# Patient Record
Sex: Female | Born: 1980 | Race: White | Hispanic: No | Smoking: Never smoker
Health system: Southern US, Community
[De-identification: ages and names within clinical notes are randomized; demographics above are authoritative.]

## PROBLEM LIST (undated history)

## (undated) DIAGNOSIS — Z8659 Personal history of other mental and behavioral disorders: Secondary | ICD-10-CM

## (undated) DIAGNOSIS — I471 Supraventricular tachycardia, unspecified: Secondary | ICD-10-CM

## (undated) DIAGNOSIS — R51 Headache: Secondary | ICD-10-CM

## (undated) DIAGNOSIS — I10 Essential (primary) hypertension: Secondary | ICD-10-CM

## (undated) HISTORY — DX: Headache: R51

## (undated) HISTORY — PX: WISDOM TOOTH EXTRACTION: SHX21

## (undated) HISTORY — PX: NO PAST SURGERIES: SHX2092

## (undated) HISTORY — DX: Essential (primary) hypertension: I10

---

## 2003-03-24 ENCOUNTER — Other Ambulatory Visit: Admission: RE | Admit: 2003-03-24 | Discharge: 2003-03-24 | Payer: Self-pay | Admitting: Obstetrics and Gynecology

## 2003-11-20 ENCOUNTER — Other Ambulatory Visit: Admission: RE | Admit: 2003-11-20 | Discharge: 2003-11-20 | Payer: Self-pay | Admitting: Obstetrics and Gynecology

## 2004-03-21 ENCOUNTER — Emergency Department (HOSPITAL_COMMUNITY): Admission: EM | Admit: 2004-03-21 | Discharge: 2004-03-21 | Payer: Self-pay | Admitting: Emergency Medicine

## 2004-05-30 ENCOUNTER — Inpatient Hospital Stay (HOSPITAL_COMMUNITY): Admission: AD | Admit: 2004-05-30 | Discharge: 2004-06-03 | Payer: Self-pay | Admitting: Obstetrics and Gynecology

## 2004-07-06 ENCOUNTER — Other Ambulatory Visit: Admission: RE | Admit: 2004-07-06 | Discharge: 2004-07-06 | Payer: Self-pay | Admitting: Obstetrics and Gynecology

## 2006-01-22 ENCOUNTER — Emergency Department (HOSPITAL_COMMUNITY): Admission: EM | Admit: 2006-01-22 | Discharge: 2006-01-22 | Payer: Self-pay | Admitting: Emergency Medicine

## 2006-07-13 ENCOUNTER — Inpatient Hospital Stay (HOSPITAL_COMMUNITY): Admission: RE | Admit: 2006-07-13 | Discharge: 2006-07-15 | Payer: Self-pay | Admitting: Obstetrics and Gynecology

## 2007-01-19 ENCOUNTER — Emergency Department (HOSPITAL_COMMUNITY): Admission: EM | Admit: 2007-01-19 | Discharge: 2007-01-19 | Payer: Self-pay | Admitting: Emergency Medicine

## 2009-01-31 ENCOUNTER — Emergency Department (HOSPITAL_COMMUNITY): Admission: EM | Admit: 2009-01-31 | Discharge: 2009-01-31 | Payer: Self-pay | Admitting: Emergency Medicine

## 2009-10-22 ENCOUNTER — Inpatient Hospital Stay (HOSPITAL_COMMUNITY): Admission: RE | Admit: 2009-10-22 | Discharge: 2009-10-24 | Payer: Self-pay | Admitting: Obstetrics and Gynecology

## 2010-02-28 NOTE — L&D Delivery Note (Signed)
Delivery Note I examined the pt at about 1245 and AROM clear fluid.  She was comfortable with epidural, VE 5 cm.  She continued to progress to complete and pushed well.  At 4:40 PM a viable and healthy female was delivered via Vaginal, Spontaneous Delivery (Presentation: Left Occiput Anterior).  APGAR: 9, 9; weight 9 lbs, 6 oz .   Placenta status: Intact, Spontaneous.  Cord: 3 vessels with the following complications: None.    Anesthesia: Epidural  Episiotomy: None Lacerations: None Suture Repair: none Est. Blood Loss (mL): 400  Mom to postpartum.  Baby to nursery-stable.  Alisha Bacus D 02/04/2011, 5:04 PM

## 2010-05-14 LAB — CBC
HCT: 18.5 % — ABNORMAL LOW (ref 36.0–46.0)
Hemoglobin: 9.6 g/dL — ABNORMAL LOW (ref 12.0–15.0)
MCH: 26.7 pg (ref 26.0–34.0)
MCH: 27.3 pg (ref 26.0–34.0)
MCHC: 33.9 g/dL (ref 30.0–36.0)
MCV: 80.3 fL (ref 78.0–100.0)
MCV: 80.4 fL (ref 78.0–100.0)
RBC: 3.6 MIL/uL — ABNORMAL LOW (ref 3.87–5.11)
RDW: 15.6 % — ABNORMAL HIGH (ref 11.5–15.5)

## 2010-05-14 LAB — COMPREHENSIVE METABOLIC PANEL
AST: 25 U/L (ref 0–37)
Alkaline Phosphatase: 130 U/L — ABNORMAL HIGH (ref 39–117)
CO2: 20 mEq/L (ref 19–32)
Chloride: 111 mEq/L (ref 96–112)
Creatinine, Ser: 0.59 mg/dL (ref 0.4–1.2)
GFR calc non Af Amer: >60 mL/min (ref >60)
Potassium: 3.6 mEq/L (ref 3.5–5.1)
Total Bilirubin: 0.3 mg/dL (ref 0.3–1.2)

## 2010-05-14 LAB — URIC ACID: Uric Acid, Serum: 5.3 mg/dL (ref 2.4–7.0)

## 2010-05-14 LAB — RPR: RPR Ser Ql: NONREACTIVE

## 2010-06-25 ENCOUNTER — Inpatient Hospital Stay (HOSPITAL_COMMUNITY): Payer: BC Managed Care – PPO

## 2010-06-25 ENCOUNTER — Inpatient Hospital Stay (HOSPITAL_COMMUNITY)
Admission: AD | Admit: 2010-06-25 | Discharge: 2010-06-25 | Disposition: A | Discharge status: Home / Self Care | Payer: BC Managed Care – PPO | Source: Ambulatory Visit | Attending: Obstetrics and Gynecology | Admitting: Obstetrics and Gynecology

## 2010-06-25 DIAGNOSIS — R109 Unspecified abdominal pain: Secondary | ICD-10-CM

## 2010-06-25 DIAGNOSIS — Z30431 Encounter for routine checking of intrauterine contraceptive device: Secondary | ICD-10-CM

## 2010-06-25 DIAGNOSIS — O99891 Other specified diseases and conditions complicating pregnancy: Secondary | ICD-10-CM | POA: Insufficient documentation

## 2010-06-25 DIAGNOSIS — O9989 Other specified diseases and conditions complicating pregnancy, childbirth and the puerperium: Secondary | ICD-10-CM

## 2010-07-16 NOTE — H&P (Signed)
NAME:  Jocelyn Bridges, Jocelyn Bridges                   ACCOUNT NO.:  0   MEDICAL RECORD NO.:  [id number]             PATIENT TYPE:   LOCATION:                                 FACILITY:   PHYSICIAN:  David C. Rana Snare, M.D.    DATE OF BIRTH:  January 19, 1981   DATE OF ADMISSION:  05/30/2004  DATE OF DISCHARGE:                                HISTORY & PHYSICAL   She is being admitted through Labor & Delivery.   HPI:  Ms. Goga is a 30 year old G1 at 37-1/[redacted] weeks gestational age.  She  presents for two-stage induction due to preeclampsia.  She has been on  bedrest for the last 3-4 weeks, having elevated blood pressures in the 130s-  140s/80s-90s with no protein.  She presents for induction of labor.  EDC is  June 15, 2004.  She has a negative Group B Strep and at the time of  presentation she has no CNS symptoms.   PAST MEDICAL HISTORY:  Negative.   PAST SURGICAL HISTORY:  Also is negative.   MEDICATIONS:  Prenatal vitamins.   SHE HAS NO KNOWN DRUG ALLERGIES.   Blood type is O positive.  VDRL nonreactive, Rubella immune, Hepatitis B  negative and Gonorrhea and Chlamydia were also negative.   PHYSICAL EXAM:  On May 27, 2004, blood pressure 132/88, fetal heart tones  were regular.  ABDOMEN:  Gravida, 37 cm.  CERVIX:  Closed, thick and high.   IMPRESSION AND PLAN:  Intrauterine pregnancy at 37-1/[redacted] weeks gestational age  with mild preeclampsia.  Plan two-stage induction of labor, plan Cytotec  cervical ripening followed by Pitocin and amniotomy.  Will check PIH labs  and for now will not use magnesium sulfate unless CNS symptoms present.      DCL/MEDQ  D:  05/30/2004  T:  05/30/2004  Job:  [id number]

## 2010-07-16 NOTE — Op Note (Signed)
NAMETOVA, VATER NO.:  [id number]   MEDICAL RECORD NO.:  [id number]          PATIENT TYPE:  INP   LOCATION:  9147                          FACILITY:  [facility]   PHYSICIAN:  Juluis Mire, M.D.   DATE OF BIRTH:  01/10/1981   DATE OF PROCEDURE:  06/01/2004  DATE OF DISCHARGE:                                 OPERATIVE REPORT   DELIVERY NOTE:  The patient had spontaneous vaginal delivery of a viable  female.  Apgars were 8/9.  PH is pending.  There was no episiotomy.  She did  have a left-sided wall vaginal tear and a third degree extension of a  midline tear that went to the right of the rectum.  The sphincter was  disrupted somewhat.  There was no evidence of any rectal or mucosal injury.  First the left pelvic sidewall laceration was repaired with a running  locking suture of 2-0 chromic.  Next, we went and repaired the rectal  sphincter with interrupted figure-of-eights of 2-0 chromic.  The deep  muscles were also bed together  with figure-of-eights of 2-0 chromic.  Then  we started the vaginal apex with the tear and did a running suture of 3-0  chromic down to the vaginal opening.  Next, we did deeper sutures of 3-0  chromic and ran it down to near the rectal opening. Then we did a  subcuticular stitch to bring the perineal skin together.  After this we had  good approximation.  Rectal exam was unremarkable.  There was no active  bleeding.  Placenta had been removed intact with a three-vessel cord.  The  estimated blood loss was 500 cc.  Anesthesia was epidural and local 1%  Xylocaine.  Patient and baby were doing well postpartum.      JSM/MEDQ  D:  06/01/2004  T:  06/01/2004  Job:  [id number]

## 2010-08-19 LAB — ABO/RH

## 2010-08-19 LAB — HIV ANTIBODY (ROUTINE TESTING W REFLEX): HIV: NONREACTIVE

## 2010-08-19 LAB — RUBELLA ANTIBODY, IGM: Rubella: IMMUNE

## 2010-08-19 LAB — ANTIBODY SCREEN: Antibody Screen: NEGATIVE

## 2010-08-19 LAB — HEPATITIS B SURFACE ANTIGEN: Hepatitis B Surface Ag: NEGATIVE

## 2010-08-19 LAB — RPR: RPR: NONREACTIVE

## 2010-08-19 LAB — GC/CHLAMYDIA PROBE AMP, GENITAL: Gonorrhea: NEGATIVE

## 2011-01-31 ENCOUNTER — Encounter (HOSPITAL_COMMUNITY): Payer: Self-pay | Admitting: *Deleted

## 2011-01-31 ENCOUNTER — Telehealth (HOSPITAL_COMMUNITY): Payer: Self-pay | Admitting: *Deleted

## 2011-01-31 NOTE — Telephone Encounter (Signed)
Preadmission screen  

## 2011-02-01 ENCOUNTER — Encounter (HOSPITAL_COMMUNITY): Payer: Self-pay | Admitting: *Deleted

## 2011-02-04 ENCOUNTER — Inpatient Hospital Stay (HOSPITAL_COMMUNITY)
Admission: RE | Admit: 2011-02-04 | Discharge: 2011-02-06 | DRG: 373 | Disposition: A | Discharge status: Home / Self Care | Payer: BC Managed Care – PPO | Source: Ambulatory Visit | Attending: Obstetrics and Gynecology | Admitting: Obstetrics and Gynecology

## 2011-02-04 ENCOUNTER — Inpatient Hospital Stay (HOSPITAL_COMMUNITY): Payer: BC Managed Care – PPO | Admitting: Anesthesiology

## 2011-02-04 ENCOUNTER — Encounter (HOSPITAL_COMMUNITY): Payer: Self-pay | Admitting: Anesthesiology

## 2011-02-04 ENCOUNTER — Encounter (HOSPITAL_COMMUNITY): Payer: Self-pay

## 2011-02-04 DIAGNOSIS — Z3490 Encounter for supervision of normal pregnancy, unspecified, unspecified trimester: Secondary | ICD-10-CM

## 2011-02-04 DIAGNOSIS — O99892 Other specified diseases and conditions complicating childbirth: Principal | ICD-10-CM | POA: Diagnosis present

## 2011-02-04 DIAGNOSIS — Z22330 Carrier of Group B streptococcus: Secondary | ICD-10-CM

## 2011-02-04 HISTORY — DX: Encounter for supervision of normal pregnancy, unspecified, unspecified trimester: Z34.90

## 2011-02-04 LAB — CBC
Platelets: 227 10*3/uL (ref 150–400)
RDW: 16.2 % — ABNORMAL HIGH (ref 11.5–15.5)
WBC: 8.2 10*3/uL (ref 4.0–10.5)

## 2011-02-04 LAB — HIV ANTIBODY (ROUTINE TESTING W REFLEX): HIV: NONREACTIVE

## 2011-02-04 LAB — ABO/RH: ABO/RH(D): O POS

## 2011-02-04 MED ORDER — EPHEDRINE 5 MG/ML INJ: 10.0000 mg | INTRAVENOUS | Status: DC | PRN

## 2011-02-04 MED ORDER — EPHEDRINE 5 MG/ML INJ
10.0000 mg | INTRAVENOUS | Status: DC | PRN
  Filled 2011-02-04: qty 4

## 2011-02-04 MED ORDER — OXYCODONE-ACETAMINOPHEN 5-325 MG PO TABS: 1.0000 | ORAL_TABLET | ORAL | Status: DC | PRN

## 2011-02-04 MED ORDER — LACTATED RINGERS IV SOLN: 500.0000 mL | INTRAVENOUS | Status: DC | PRN

## 2011-02-04 MED ORDER — ONDANSETRON HCL 4 MG/2ML IJ SOLN: 4.0000 mg | INTRAMUSCULAR | Status: DC | PRN

## 2011-02-04 MED ORDER — OXYCODONE-ACETAMINOPHEN 5-325 MG PO TABS
1.0000 | ORAL_TABLET | ORAL | Status: DC | PRN
  Administered 2011-02-05 – 2011-02-06 (×5): 1 via ORAL
  Filled 2011-02-04 (×6): qty 1

## 2011-02-04 MED ORDER — DIBUCAINE 1 % RE OINT: 1.0000 "application " | TOPICAL_OINTMENT | RECTAL | Status: DC | PRN

## 2011-02-04 MED ORDER — SIMETHICONE 80 MG PO CHEW: 80.0000 mg | CHEWABLE_TABLET | ORAL | Status: DC | PRN

## 2011-02-04 MED ORDER — METHYLERGONOVINE MALEATE 0.2 MG PO TABS: 0.2000 mg | ORAL_TABLET | ORAL | Status: DC | PRN

## 2011-02-04 MED ORDER — ONDANSETRON HCL 4 MG/2ML IJ SOLN: 4.0000 mg | Freq: Four times a day (QID) | INTRAMUSCULAR | Status: DC | PRN

## 2011-02-04 MED ORDER — PENICILLIN G POTASSIUM 5000000 UNITS IJ SOLR
5.0000 10*6.[IU] | Freq: Once | INTRAVENOUS | Status: AC
  Administered 2011-02-04: 5 10*6.[IU] via INTRAVENOUS
  Filled 2011-02-04: qty 5

## 2011-02-04 MED ORDER — OXYTOCIN 20 UNITS IN LACTATED RINGERS INFUSION - SIMPLE
1.0000 m[IU]/min | INTRAVENOUS | Status: DC
  Administered 2011-02-04: 333 m[IU]/min via INTRAVENOUS
  Administered 2011-02-04: 2 m[IU]/min via INTRAVENOUS
  Filled 2011-02-04: qty 1000

## 2011-02-04 MED ORDER — BUPIVACAINE HCL (PF) 0.25 % IJ SOLN
INTRAMUSCULAR | Status: DC | PRN
  Administered 2011-02-04: 5 mL

## 2011-02-04 MED ORDER — METHYLERGONOVINE MALEATE 0.2 MG/ML IJ SOLN: 0.2000 mg | INTRAMUSCULAR | Status: DC | PRN

## 2011-02-04 MED ORDER — IBUPROFEN 600 MG PO TABS: 600.0000 mg | ORAL_TABLET | Freq: Four times a day (QID) | ORAL | Status: DC | PRN

## 2011-02-04 MED ORDER — LANOLIN HYDROUS EX OINT: TOPICAL_OINTMENT | CUTANEOUS | Status: DC | PRN

## 2011-02-04 MED ORDER — LIDOCAINE HCL 1.5 % IJ SOLN
INTRAMUSCULAR | Status: DC | PRN
  Administered 2011-02-04 (×2): 5 mL via EPIDURAL

## 2011-02-04 MED ORDER — PENICILLIN G POTASSIUM 5000000 UNITS IJ SOLR
2.5000 10*6.[IU] | INTRAVENOUS | Status: DC
  Administered 2011-02-04 (×2): 2.5 10*6.[IU] via INTRAVENOUS
  Filled 2011-02-04 (×5): qty 2.5

## 2011-02-04 MED ORDER — TERBUTALINE SULFATE 1 MG/ML IJ SOLN: 0.2500 mg | Freq: Once | INTRAMUSCULAR | Status: DC | PRN

## 2011-02-04 MED ORDER — DIPHENHYDRAMINE HCL 25 MG PO CAPS
25.0000 mg | ORAL_CAPSULE | Freq: Four times a day (QID) | ORAL | Status: DC | PRN
  Administered 2011-02-04: 25 mg via ORAL
  Filled 2011-02-04: qty 1

## 2011-02-04 MED ORDER — SENNOSIDES-DOCUSATE SODIUM 8.6-50 MG PO TABS
2.0000 | ORAL_TABLET | Freq: Every day | ORAL | Status: DC
  Administered 2011-02-05: 2 via ORAL

## 2011-02-04 MED ORDER — IBUPROFEN 600 MG PO TABS
600.0000 mg | ORAL_TABLET | Freq: Four times a day (QID) | ORAL | Status: DC
  Administered 2011-02-04 – 2011-02-06 (×6): 600 mg via ORAL
  Filled 2011-02-04 (×6): qty 1

## 2011-02-04 MED ORDER — MEASLES, MUMPS & RUBELLA VAC ~~LOC~~ INJ: 0.5000 mL | INJECTION | Freq: Once | SUBCUTANEOUS | Status: DC

## 2011-02-04 MED ORDER — LIDOCAINE-EPINEPHRINE 2 %-1:100000 IJ SOLN
INTRAMUSCULAR | Status: DC | PRN
  Administered 2011-02-04: 5 mL via INTRADERMAL

## 2011-02-04 MED ORDER — WITCH HAZEL-GLYCERIN EX PADS: 1.0000 "application " | MEDICATED_PAD | CUTANEOUS | Status: DC | PRN

## 2011-02-04 MED ORDER — LIDOCAINE HCL (PF) 1 % IJ SOLN
30.0000 mL | INTRAMUSCULAR | Status: DC | PRN
  Filled 2011-02-04: qty 30

## 2011-02-04 MED ORDER — LACTATED RINGERS IV SOLN: 500.0000 mL | Freq: Once | INTRAVENOUS | Status: DC

## 2011-02-04 MED ORDER — FENTANYL 2.5 MCG/ML BUPIVACAINE 1/10 % EPIDURAL INFUSION (WH - ANES)
INTRAMUSCULAR | Status: DC | PRN
  Administered 2011-02-04: 14 mL/h via EPIDURAL

## 2011-02-04 MED ORDER — TETANUS-DIPHTH-ACELL PERTUSSIS 5-2.5-18.5 LF-MCG/0.5 IM SUSP: 0.5000 mL | Freq: Once | INTRAMUSCULAR | Status: DC

## 2011-02-04 MED ORDER — ZOLPIDEM TARTRATE 5 MG PO TABS: 5.0000 mg | ORAL_TABLET | Freq: Every evening | ORAL | Status: DC | PRN

## 2011-02-04 MED ORDER — OXYTOCIN 20 UNITS IN LACTATED RINGERS INFUSION - SIMPLE: 125.0000 mL/h | INTRAVENOUS | Status: DC | PRN

## 2011-02-04 MED ORDER — CITRIC ACID-SODIUM CITRATE 334-500 MG/5ML PO SOLN: 30.0000 mL | ORAL | Status: DC | PRN

## 2011-02-04 MED ORDER — MAGNESIUM HYDROXIDE 400 MG/5ML PO SUSP: 30.0000 mL | ORAL | Status: DC | PRN

## 2011-02-04 MED ORDER — OXYTOCIN 10 UNIT/ML IJ SOLN
INTRAMUSCULAR | Status: AC
  Filled 2011-02-04: qty 2

## 2011-02-04 MED ORDER — PHENYLEPHRINE 40 MCG/ML (10ML) SYRINGE FOR IV PUSH (FOR BLOOD PRESSURE SUPPORT)
80.0000 ug | PREFILLED_SYRINGE | INTRAVENOUS | Status: DC | PRN
  Filled 2011-02-04: qty 5

## 2011-02-04 MED ORDER — LACTATED RINGERS IV SOLN
INTRAVENOUS | Status: DC
Start: 2011-02-04 — End: 2011-02-04
  Administered 2011-02-04 (×4): via INTRAVENOUS

## 2011-02-04 MED ORDER — PHENYLEPHRINE 40 MCG/ML (10ML) SYRINGE FOR IV PUSH (FOR BLOOD PRESSURE SUPPORT): 80.0000 ug | PREFILLED_SYRINGE | INTRAVENOUS | Status: DC | PRN

## 2011-02-04 MED ORDER — ONDANSETRON HCL 4 MG PO TABS: 4.0000 mg | ORAL_TABLET | ORAL | Status: DC | PRN

## 2011-02-04 MED ORDER — PRENATAL PLUS 27-1 MG PO TABS
1.0000 | ORAL_TABLET | Freq: Every day | ORAL | Status: DC
  Administered 2011-02-05 – 2011-02-06 (×2): 1 via ORAL
  Filled 2011-02-04 (×2): qty 1

## 2011-02-04 MED ORDER — OXYTOCIN 20 UNITS IN LACTATED RINGERS INFUSION - SIMPLE: 125.0000 mL/h | Freq: Once | INTRAVENOUS | Status: DC

## 2011-02-04 MED ORDER — ACETAMINOPHEN 325 MG PO TABS: 650.0000 mg | ORAL_TABLET | ORAL | Status: DC | PRN

## 2011-02-04 MED ORDER — DIPHENHYDRAMINE HCL 50 MG/ML IJ SOLN: 12.5000 mg | INTRAMUSCULAR | Status: DC | PRN

## 2011-02-04 MED ORDER — FENTANYL 2.5 MCG/ML BUPIVACAINE 1/10 % EPIDURAL INFUSION (WH - ANES)
14.0000 mL/h | INTRAMUSCULAR | Status: DC
  Administered 2011-02-04: 14 mL/h via EPIDURAL
  Filled 2011-02-04 (×2): qty 60

## 2011-02-04 MED ORDER — BENZOCAINE-MENTHOL 20-0.5 % EX AERO: 1.0000 "application " | INHALATION_SPRAY | CUTANEOUS | Status: DC | PRN

## 2011-02-04 MED ORDER — OXYTOCIN BOLUS FROM INFUSION
500.0000 mL | Freq: Once | INTRAVENOUS | Status: DC
  Filled 2011-02-04: qty 500

## 2011-02-04 NOTE — Anesthesia Preprocedure Evaluation (Signed)
Anesthesia Evaluation  Patient identified by MRN, date of birth, ID band Patient awake    Reviewed: Allergy & Precautions, H&P , NPO status , Patient's Chart, lab work & pertinent test results  Airway Mallampati: II TM Distance: >3 FB Neck ROM: full    Dental No notable dental hx.    Pulmonary neg pulmonary ROS,    Pulmonary exam normal       Cardiovascular neg cardio ROS     Neuro/Psych Negative Psych ROS   GI/Hepatic negative GI ROS, Neg liver ROS,   Endo/Other  Morbid obesity  Renal/GU negative Renal ROS  Genitourinary negative   Musculoskeletal negative musculoskeletal ROS (+)   Abdominal (+) obese,   Peds negative pediatric ROS (+)  Hematology negative hematology ROS (+)   Anesthesia Other Findings   Reproductive/Obstetrics (+) Pregnancy                           Anesthesia Physical Anesthesia Plan  ASA: III  Anesthesia Plan: Epidural   Post-op Pain Management:    Induction:   Airway Management Planned:   Additional Equipment:   Intra-op Plan:   Post-operative Plan:   Informed Consent: I have reviewed the patients History and Physical, chart, labs and discussed the procedure including the risks, benefits and alternatives for the proposed anesthesia with the patient or authorized representative who has indicated his/her understanding and acceptance.     Plan Discussed with:   Anesthesia Plan Comments:         Anesthesia Quick Evaluation

## 2011-02-04 NOTE — H&P (Signed)
Jocelyn Bridges is a 30 y.o. female, G4 P3003, EGA 39+ weeks presenting for elective induction with favorable cervix.  Prenatal care essentially uncomplicated, see prenatal records for complete history.  History OB History    Grav Para Term Preterm Abortions TAB SAB Ect Mult Living   4 3 3       3      Past Medical History  Diagnosis Date  . Headache     miagraine   Past Surgical History  Procedure Date  . Wisdom tooth extraction    Family History: family history includes Cancer in her paternal grandfather; Diabetes in her father; Hypertension in her father and mother; and Seizures in her brother.  There is no history of Anesthesia problems, and Hypotension, and Malignant hyperthermia, and Pseudochol deficiency, . Social History:  reports that she has never smoked. She has never used smokeless tobacco. She reports that she does not drink alcohol or use illicit drugs.  Review of Systems  Respiratory: Negative.   Cardiovascular: Negative.     Dilation: 4 Effacement (%): 60 Station: -2 Exam by:: Raliegh Ip RN Blood pressure 141/93, pulse 99, temperature 98.2 F (36.8 C), temperature source Oral, resp. rate 20, height 5\' 9"  (1.753 m), weight 104.327 kg (230 lb), last menstrual period 05/03/2010. Maternal Exam:  Uterine Assessment: Contraction strength is mild.  Contraction frequency is irregular.   Abdomen: Patient reports no abdominal tenderness. Estimated fetal weight is 8 lbs.   Fetal presentation: vertex  Introitus: Normal vulva. Normal vagina.  Pelvis: adequate for delivery.   Cervix: Cervix evaluated by digital exam.     Fetal Exam Fetal Monitor Review: Mode: ultrasound.   Baseline rate: 130s.  Variability: moderate (6-25 bpm).   Pattern: accelerations present and no decelerations.    Fetal State Assessment: Category I - tracings are normal.     Physical Exam  Constitutional: She appears well-developed and well-nourished.  Neck: Neck supple. No thyromegaly  present.  Cardiovascular: Normal rate, regular rhythm and normal heart sounds.   No murmur heard. Respiratory: Effort normal and breath sounds normal. No respiratory distress. She has no wheezes.  GI: Soft.       gravid    Prenatal labs: ABO, Rh: O/Positive/-- (06/21 0000) Antibody: Negative (06/21 0000) Rubella: Immune (06/21 0000) RPR: Nonreactive (12/07 0000)  HBsAg: Negative (06/21 0000)  HIV: Non-reactive (12/07 0000)  GBS: Positive (11/08 0000)   Assessment/Plan: IUP at 39+ weeks for elective induction, GBS positive.  On pitocin and PCN, will AROM at lunchtime if no SROM.  Monitor progress.   Flavio Lindroth D 02/04/2011, 8:43 AM

## 2011-02-04 NOTE — Anesthesia Postprocedure Evaluation (Signed)
Anesthesia Post Note  Patient: Jocelyn Bridges  Procedure(s) Performed: * No procedures listed *  Anesthesia type: Epidural  Patient location: Mother/Baby  Post pain: Pain level controlled  Post assessment: Post-op Vital signs reviewed  Last Vitals:  Filed Vitals:   02/04/11 1847  BP: 133/82  Pulse: 102  Temp: 37.1 C  Resp: 18    Post vital signs: Reviewed  Level of consciousness: awake  Complications: No apparent anesthesia complications

## 2011-02-04 NOTE — Anesthesia Procedure Notes (Signed)
Epidural Patient location during procedure: OB Start time: 02/04/2011 11:10 AM End time: 02/04/2011 11:14 AM Reason for block: procedure for pain  Staffing Anesthesiologist: Sandrea Hughs Performed by: anesthesiologist   Preanesthetic Checklist Completed: patient identified, site marked, surgical consent, pre-op evaluation, timeout performed, IV checked, risks and benefits discussed and monitors and equipment checked  Epidural Patient position: sitting Prep: site prepped and draped and DuraPrep Patient monitoring: continuous pulse ox and blood pressure Approach: midline Injection technique: LOR air  Needle:  Needle type: Tuohy  Needle gauge: 17 G Needle length: 9 cm Needle insertion depth: 7 cm Catheter type: closed end flexible Catheter size: 19 Gauge Catheter at skin depth: 13 cm Test dose: negative and 1.5% lidocaine  Assessment Sensory level: T8 Events: blood not aspirated, injection not painful, no injection resistance, negative IV test and no paresthesia

## 2011-02-05 NOTE — Anesthesia Postprocedure Evaluation (Signed)
  Anesthesia Post-op Note  Patient: Jocelyn Bridges  Procedure(s) Performed: * No procedures listed *  Patient Location: Mother/Baby  Anesthesia Type: Epidural  Level of Consciousness: awake, alert  and oriented  Airway and Oxygen Therapy: Patient Spontanous Breathing  Post-op Pain: mild  Post-op Assessment: Patient's Cardiovascular Status Stable, Respiratory Function Stable, Patent Airway, No signs of Nausea or vomiting and Pain level controlled  Post-op Vital Signs: stable  Complications: No apparent anesthesia complications
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
## 2011-02-05 NOTE — Addendum Note (Signed)
Addendum  created 02/05/11 1014 by Quin Hoop Avry Roedl   Modules edited:Charges VN, Notes Section

## 2011-02-05 NOTE — Progress Notes (Signed)
Patient has not had any GI complaints throughout the night.  Tolerated a regular diet last night without any complaints.   Abdomen soft, bowel sounds audible x 4 quadrants.  Has ambulated in the halls x 3 throughout the shift.  Pt verbalizes that she feels better.

## 2011-02-05 NOTE — Progress Notes (Signed)
PPD#1 Cramps with nursing Afeb, VSS Fundus firm, NT at U-0 Continue routine care

## 2011-02-06 MED ORDER — OXYCODONE-ACETAMINOPHEN 5-325 MG PO TABS: 1.0000 | ORAL_TABLET | ORAL | Status: AC | PRN

## 2011-02-06 MED ORDER — IBUPROFEN 600 MG PO TABS: 600.0000 mg | ORAL_TABLET | Freq: Four times a day (QID) | ORAL | Status: AC

## 2011-02-06 NOTE — Discharge Summary (Signed)
Obstetric Discharge Summary Reason for Admission: induction of labor Prenatal Procedures: none Intrapartum Procedures: spontaneous vaginal delivery Postpartum Procedures: none Complications-Operative and Postpartum: none Hemoglobin  Date Value Range Status  02/04/2011 9.5* 12.0-15.0 (g/dL) Final     HCT  Date Value Range Status  02/04/2011 30.5* 36.0-46.0 (%) Final    Discharge Diagnoses: Term Pregnancy-delivered  Discharge Information: Date: 02/06/2011 Activity: pelvic rest Diet: routine Medications: Ibuprofen and Percocet Condition: stable Instructions: refer to practice specific booklet Discharge to: home Follow-up Information    Follow up with Jocelyn Solana D, MD. Make an appointment in 6 weeks.   Contact information:   [address], [address] [place] [address] [phone]          Newborn Data: Live born female  Birth Weight: 9 lb 6.8 oz (4275 g) APGAR: 9, 9  Home with mother.  Jocelyn Bridges 02/06/2011, 8:29 AM

## 2011-02-06 NOTE — Progress Notes (Signed)
PPD#2 No problems Afeb, VSS Fundus- firm, NT at U-1 D/c home.  Wants to wait and see if needs Zoloft, has some at home to start if needs to for h/o postpartum depression.

## 2011-02-08 NOTE — Progress Notes (Signed)
UR Chart review completed.  

## 2011-03-01 HISTORY — PX: ESSURE TUBAL LIGATION: SUR464

## 2013-02-28 HISTORY — PX: WISDOM TOOTH EXTRACTION: SHX21

## 2013-12-30 ENCOUNTER — Encounter (HOSPITAL_COMMUNITY): Payer: Self-pay

## 2015-08-15 ENCOUNTER — Emergency Department (HOSPITAL_COMMUNITY)
Admission: EM | Admit: 2015-08-15 | Discharge: 2015-08-16 | Disposition: A | Discharge status: Other Acute Inpatient Hospital | Payer: BLUE CROSS/BLUE SHIELD | Attending: Emergency Medicine | Admitting: Emergency Medicine

## 2015-08-15 ENCOUNTER — Other Ambulatory Visit: Payer: Self-pay

## 2015-08-15 ENCOUNTER — Encounter (HOSPITAL_COMMUNITY): Payer: Self-pay

## 2015-08-15 DIAGNOSIS — T1491 Suicide attempt: Secondary | ICD-10-CM | POA: Diagnosis not present

## 2015-08-15 DIAGNOSIS — T39312A Poisoning by propionic acid derivatives, intentional self-harm, initial encounter: Secondary | ICD-10-CM | POA: Insufficient documentation

## 2015-08-15 DIAGNOSIS — F329 Major depressive disorder, single episode, unspecified: Secondary | ICD-10-CM | POA: Diagnosis not present

## 2015-08-15 DIAGNOSIS — F332 Major depressive disorder, recurrent severe without psychotic features: Secondary | ICD-10-CM | POA: Diagnosis not present

## 2015-08-15 DIAGNOSIS — T50902A Poisoning by unspecified drugs, medicaments and biological substances, intentional self-harm, initial encounter: Secondary | ICD-10-CM

## 2015-08-15 LAB — I-STAT BETA HCG BLOOD, ED (MC, WL, AP ONLY)

## 2015-08-15 LAB — COMPREHENSIVE METABOLIC PANEL
ALT: 14 U/L (ref 14–54)
AST: 17 U/L (ref 15–41)
Albumin: 4.2 g/dL (ref 3.5–5.0)
Alkaline Phosphatase: 50 U/L (ref 38–126)
Anion gap: 7 (ref 5–15)
BUN: 16 mg/dL (ref 6–20)
CHLORIDE: 109 mmol/L (ref 101–111)
CO2: 21 mmol/L — ABNORMAL LOW (ref 22–32)
CREATININE: 0.84 mg/dL (ref 0.44–1.00)
Calcium: 9.1 mg/dL (ref 8.9–10.3)
Glucose, Bld: 113 mg/dL — ABNORMAL HIGH (ref 65–99)
Potassium: 4.4 mmol/L (ref 3.5–5.1)
Sodium: 137 mmol/L (ref 135–145)
Total Bilirubin: 0.8 mg/dL (ref 0.3–1.2)
Total Protein: 7.7 g/dL (ref 6.5–8.1)

## 2015-08-15 LAB — RAPID URINE DRUG SCREEN, HOSP PERFORMED
Amphetamines: NOT DETECTED
Barbiturates: NOT DETECTED
Benzodiazepines: NOT DETECTED
Cocaine: NOT DETECTED
OPIATES: NOT DETECTED
Tetrahydrocannabinol: NOT DETECTED

## 2015-08-15 LAB — ETHANOL

## 2015-08-15 LAB — CBC
HCT: 42 % (ref 36.0–46.0)
Hemoglobin: 14.7 g/dL (ref 12.0–15.0)
MCH: 30 pg (ref 26.0–34.0)
MCHC: 35 g/dL (ref 30.0–36.0)
MCV: 85.7 fL (ref 78.0–100.0)
PLATELETS: 260 10*3/uL (ref 150–400)
RBC: 4.9 MIL/uL (ref 3.87–5.11)
RDW: 12.8 % (ref 11.5–15.5)
WBC: 11 10*3/uL — ABNORMAL HIGH (ref 4.0–10.5)

## 2015-08-15 LAB — SALICYLATE LEVEL

## 2015-08-15 LAB — ACETAMINOPHEN LEVEL: Acetaminophen (Tylenol), Serum: <10 ug/mL — ABNORMAL LOW (ref 10–30)

## 2015-08-15 LAB — CBG MONITORING, ED: Glucose-Capillary: 108 mg/dL — ABNORMAL HIGH (ref 65–99)

## 2015-08-15 MED ORDER — ZOLPIDEM TARTRATE 5 MG PO TABS
5.0000 mg | ORAL_TABLET | Freq: Every day | ORAL | Status: DC
  Administered 2015-08-15: 5 mg via ORAL
  Filled 2015-08-15: qty 1

## 2015-08-15 NOTE — ED Provider Notes (Signed)
CSN: [id number]     Arrival date & time 08/15/15  1446 History   First MD Initiated Contact with Patient 08/15/15 1717     Chief Complaint  Patient presents with  . Drug Overdose     (Consider location/radiation/quality/duration/timing/severity/associated sxs/prior Treatment) Patient is a 35 y.o. female presenting with Overdose. The history is provided by the patient.  Drug Overdose This is a new problem. The current episode started 3 to 5 hours ago. The problem occurs constantly. The problem has been resolved. Associated symptoms comments: Suicidal at the time but now just sad with hx of depression. The symptoms are aggravated by stress. Nothing relieves the symptoms. She has tried nothing for the symptoms. The treatment provided no relief.    Past Medical History  Diagnosis Date  . [id number](478.2Headache(784.0Raelyn Mora)     miagraine   Past Surgical History  Procedure Laterality Date  . Wisdom tooth extraction     Family History  Problem Relation Age of Onset  . Hypertension Mother   . Diabetes Father   . Hypertension Father   . Seizures Brother     both brothers  . Cancer Paternal Grandfather     colon  . Anesthesia problems Neg Hx   . Hypotension Neg Hx   . Malignant hyperthermia Neg Hx   . Pseudochol deficiency Neg Hx    Social History  Substance Use Topics  . Smoking status: Never Smoker   . Smokeless tobacco: Never Used  . Alcohol Use: No   OB History    Gravida Para Term Preterm AB TAB SAB Ectopic Multiple Living   4 4 4       4      Review of Systems  All other systems reviewed and are negative.     Allergies  Metoclopramide hcl  Home Medications   Prior to Admission medications   Not on File   BP 135/95 mmHg  Pulse 99  Temp(Src) 99.2 F (37.3 C) (Oral)  Resp 18  SpO2 97% Physical Exam  Constitutional: She is oriented to person, place, and time. She appears well-developed and well-nourished. No distress.  HENT:  Head: Normocephalic and atraumatic.   Mouth/Throat: Oropharynx is clear and moist.  Eyes: Conjunctivae and EOM are normal. Pupils are equal, round, and reactive to light.  Neck: Normal range of motion. Neck supple.  Cardiovascular: Normal rate, regular rhythm and intact distal pulses.   No murmur heard. Pulmonary/Chest: Effort normal and breath sounds normal. No respiratory distress. She has no wheezes. She has no rales.  Abdominal: Soft. She exhibits no distension. There is no tenderness. There is no rebound and no guarding.  Musculoskeletal: Normal range of motion. She exhibits no edema or tenderness.  Neurological: She is alert and oriented to person, place, and time.  Skin: Skin is warm and dry. No rash noted. No erythema.  Psychiatric: Her behavior is normal. She exhibits a depressed mood. She expresses no homicidal and no suicidal ideation.  tearful  Nursing note and vitals reviewed.   ED Course  Procedures (including critical care time) Labs Review Labs Reviewed  COMPREHENSIVE METABOLIC PANEL - Abnormal; Notable for the following:    CO2 21 (*)    Glucose, Bld 113 (*)    All other components within normal limits  ACETAMINOPHEN LEVEL - Abnormal; Notable for the following:    Acetaminophen (Tylenol), Serum <10 (*)    All other components within normal limits  CBC - Abnormal; Notable for the following:    WBC 11.0 (*)  All other components within normal limits  CBG MONITORING, ED - Abnormal; Notable for the following:    Glucose-Capillary 108 (*)    All other components within normal limits  ETHANOL  SALICYLATE LEVEL  URINE RAPID DRUG SCREEN, HOSP PERFORMED  I-STAT BETA HCG BLOOD, ED ([facility], [facility], [facility] ONLY)    Imaging Review No results found. I have personally reviewed and evaluated these images and lab results as part of my medical decision-making.   EKG Interpretation None      MDM   Final diagnoses:  Intentional drug overdose, initial encounter [facility])  Depression   Patient is a 35 year old female  with a history of depression presenting today after an intentional overdose of Aleve. She does not know how me tablet she took took them approximately 3 hours ago. She has no current complaints at this time except for feeling sad. She is currently denying any suicidal ideation. She did this after she and her husband got into an argument and he said he was going to leave. She states she's only had one other suicidal attempt multiple years ago when she was placed on Zoloft and it made her feel suicidal. She has been feeling more depressed and down lately but does not take any medication for it. She has not seen a counselor at this time and does not have a psychiatrist. Labs are within normal limits and patient is medically cleared.  TTS to evaluate.     Gwyneth Sprout, MD 08/15/15 1745
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
## 2015-08-15 NOTE — BH Assessment (Signed)
[facility] Assessment Progress Note   Case was staffed with Shaune PollackLord DNP who recommended an inpatient admission and will be re-evaluated in the a.m

## 2015-08-15 NOTE — BH Assessment (Addendum)
Assessment Note  Jocelyn Bridges is an 35 y.o. female that presents this date with thoughts of self harm and had a plan earlier this date reporting that she took 10 Aleve PM tablets in attempt to take her life due to a recent separation with her husband of 12 years. Patient reports that she has been separated from her husband for the last four months and has recently relocated to her own apartment with her two children. Patient stated on admission, that she was hoping to eventually reunite with her husband although after a discussion today that escalated into a non physical verbal altercation, patient stated she realized that they are going to get divorced. Patient stated she felt very over whelmed and did not want to live anymore. Patient stated after she took the medication her husband found her and contacted EMS. Patient denies any SA use but did report one prior attempt in 2011 which she attributes to postpartum depression and was prescribed Zoloft for depression by her OB/GYN. Patient stated after starting the Zoloft (one month) she had feelings of self harm and tried to overdose on that medication. Patient stated at that time she did not receive any help and did not report that incident. Patient is tearful on admission and is very regretful of her actions earlier this date. Patient states she has family support and is accompied by her mother [place] Neighbor [phone]). Patient states she has been suffering from depression "for a long time" and needs to get help. Patient is currently employed as a Runner, broadcasting/film/video. Patient denies any prior inpatient/oupatient treatment and is open to voluntary admission. Collateral information from admission note Katrinka Blazing RN stated: "She is very quiet and is not speaking much.She tearfully states she took "less than 10" (ten) Aleve PM tablets this afternoon. EMS states there was initially a suggestion of an argument between the husband and wife; and the husband told EMS that he  "stuck my fingers in her mouth to try to make her throw up".The paramedic states they counted the pills and that all were accounted for, so they questioned any real taking of the medication.She is in no distress. Patient denies any prior MH history or AVH. Case was staffed with Shaune Pollack DNP who recommended an inpatient admission and will be re-evaluated in the a.m.  Diagnosis: Major depressive D/O recurrent, severe  Past Medical History:  Past Medical History  Diagnosis Date  . OVFIEPPI([id number] Mora    Past Surgical History  Procedure Laterality Date  . Wisdom tooth extraction      Family History:  Family History  Problem Relation Age of Onset  . Hypertension Mother   . Diabetes Father   . Hypertension Father   . Seizures Brother     both brothers  . Cancer Paternal Grandfather     colon  . Anesthesia problems Neg Hx   . Hypotension Neg Hx   . Malignant hyperthermia Neg Hx   . Pseudochol deficiency Neg Hx     Social History:  reports that she has never smoked. She has never used smokeless tobacco. She reports that she does not drink alcohol or use illicit drugs.  Additional Social History:  Alcohol / Drug Use Pain Medications: See MAR Prescriptions: See MAR Over the Counter: See MAR History of alcohol / drug use?: No history of alcohol / drug abuse  CIWA: CIWA-Ar BP: 135/95 mmHg Pulse Rate: 99 COWS:    Allergies:  Allergies  Allergen Reactions  . Metoclopramide Hcl  Muscle tightness restlessness    Home Medications:  (Not in a hospital admission)  OB/GYN Status:  No LMP recorded.  General Assessment Data Location of Assessment: [facility] TTS Assessment: In system Is this a Tele or Face-to-Face Assessment?: Face-to-Face Is this an Initial Assessment or a Re-assessment for this encounter?: Initial Assessment Marital status: Married CarneyMaiden name: na Is patient pregnant?: No Pregnancy Status: No Living Arrangements: Alone Can pt return to current  living arrangement?: Yes Admission Status: Voluntary Is patient capable of signing voluntary admission?: Yes Referral Source: Self/Family/Friend Insurance type: Tax adviserBC/BS  Medical Screening Exam [facility]) Medical Exam completed: Yes  Crisis Care Plan Living Arrangements: Alone Legal Guardian: Other: (none) Name of Psychiatrist: None Name of Therapist: None  Education Status Is patient currently in school?: No Current Grade: na Highest grade of school patient has completed: 12 Name of school: na Contact person: na  Risk to self with the past 6 months Suicidal Ideation: Yes-Currently Present Has patient been a risk to self within the past 6 months prior to admission? : No Suicidal Intent: No (pt states earlier today) Has patient had any suicidal intent within the past 6 months prior to admission? : No Is patient at risk for suicide?: Yes Suicidal Plan?: No (pt had a plan earlier) Has patient had any suicidal plan within the past 6 months prior to admission? : No Access to Means: Yes Specify Access to Suicidal Means: pt had medications What has been your use of drugs/alcohol within the last 12 months?: Denies Previous Attempts/Gestures: Yes How many times?: 1 Other Self Harm Risks: none Triggers for Past Attempts: Other (Comment) (relationship issues) Intentional Self Injurious Behavior: None Family Suicide History: No Recent stressful life event(s): Divorce Persecutory voices/beliefs?: No Depression: Yes Depression Symptoms: Feeling worthless/self pity, Feeling angry/irritable Substance abuse history and/or treatment for substance abuse?: No Suicide prevention information given to non-admitted patients: Not applicable  Risk to Others within the past 6 months Homicidal Ideation: No Does patient have any lifetime risk of violence toward others beyond the six months prior to admission? : No Thoughts of Harm to Others: No Current Homicidal Intent: No Current  Homicidal Plan: No Access to Homicidal Means: No Identified Victim: na History of harm to others?: No Assessment of Violence: None Noted Violent Behavior Description: none Does patient have access to weapons?: No Criminal Charges Pending?: No Does patient have a court date: No Is patient on probation?: No  Psychosis Hallucinations: None noted Delusions: None noted  Mental Status Report Appearance/Hygiene: Unremarkable Eye Contact: Good Motor Activity: Freedom of movement Speech: Logical/coherent Level of Consciousness: Alert Mood: Anxious, Depressed Affect: Anxious, Depressed Anxiety Level: Moderate Thought Processes: Coherent, Relevant Judgement: Unimpaired Orientation: Person, Place, Time Obsessive Compulsive Thoughts/Behaviors: None  Cognitive Functioning Concentration: Normal Memory: Recent Intact, Remote Intact IQ: Average Insight: Good Impulse Control: Poor Appetite: Good Weight Loss: 0 Weight Gain: 0 Sleep: Decreased Total Hours of Sleep: 3 Vegetative Symptoms: Staying in bed  ADLScreening [facility]) Patient's cognitive ability adequate to safely complete daily activities?: Yes Patient able to express need for assistance with ADLs?: Yes Independently performs ADLs?: Yes (appropriate for developmental age)  Prior Inpatient Therapy Prior Inpatient Therapy: No Prior Therapy Dates: na Prior Therapy Facilty/Provider(s): none Reason for Treatment: na  Prior Outpatient Therapy Prior Outpatient Therapy: No Prior Therapy Dates: na Prior Therapy Facilty/Provider(s): none Reason for Treatment: na Does patient have an ACCT team?: No Does patient have Intensive In-House Services?  : No Does patient have [employer]  services? : No Does patient have P4CC services?: No  ADL Screening (condition at time of admission) Patient's cognitive ability adequate to safely complete daily activities?: Yes Is the patient deaf or have difficulty hearing?: No Does  the patient have difficulty seeing, even when wearing glasses/contacts?: No Does the patient have difficulty concentrating, remembering, or making decisions?: No Patient able to express need for assistance with ADLs?: Yes Does the patient have difficulty dressing or bathing?: No Independently performs ADLs?: Yes (appropriate for developmental age) Does the patient have difficulty walking or climbing stairs?: No Weakness of Legs: None Weakness of Arms/Hands: None  Home Assistive Devices/Equipment Home Assistive Devices/Equipment: None  Therapy Consults (therapy consults require a physician order) PT Evaluation Needed: No OT Evalulation Needed: No SLP Evaluation Needed: No Abuse/Neglect Assessment (Assessment to be complete while patient is alone) Physical Abuse: Denies Verbal Abuse: Denies Sexual Abuse: Denies Exploitation of patient/patient's resources: Denies Self-Neglect: Denies Values / Beliefs Cultural Requests During Hospitalization: None Spiritual Requests During Hospitalization: None Consults Spiritual Care Consult Needed: No Social Work Consult Needed: No Merchant navy officer (For Healthcare) Does patient have an advance directive?: No Would patient like information on creating an advanced directive?: No - patient declined information (pt declined information)    Additional Information 1:1 In Past 12 Months?: No CIRT Risk: No Elopement Risk: No Does patient have medical clearance?: Yes     Disposition: Case was staffed with Shaune Pollack DNP who recommended an inpatient admission and will be re-evaluated in the a.m.  Disposition Initial Assessment Completed for this Encounter: Yes Disposition of Patient: Other dispositions (re-evaluate in the a.m.) Other disposition(s): Other (Comment) (observation and re-evaluate in a.m.)  On Site Evaluation by:   Reviewed with Physician:    Alfredia Ferguson 08/15/2015 6:06 PM

## 2015-08-15 NOTE — ED Notes (Signed)
Patient noted in room. No complaints, stable, in no acute distress. Q15 minute rounds and monitoring via security cameras continue for safety. Pt denies SI, HI, A/ V H, and pain, pt endorses anxiety and depression and can be tearful at times. Pt contracts for safety.

## 2015-08-16 ENCOUNTER — Inpatient Hospital Stay (HOSPITAL_COMMUNITY)
Admission: AD | Admit: 2015-08-16 | Discharge: 2015-08-18 | DRG: 885 | Disposition: A | Discharge status: Home / Self Care | Payer: BLUE CROSS/BLUE SHIELD | Source: Intra-hospital | Attending: Psychiatry | Admitting: Psychiatry

## 2015-08-16 ENCOUNTER — Encounter (HOSPITAL_COMMUNITY): Payer: Self-pay | Admitting: *Deleted

## 2015-08-16 DIAGNOSIS — F332 Major depressive disorder, recurrent severe without psychotic features: Secondary | ICD-10-CM

## 2015-08-16 DIAGNOSIS — T1491 Suicide attempt: Secondary | ICD-10-CM | POA: Diagnosis not present

## 2015-08-16 DIAGNOSIS — Z833 Family history of diabetes mellitus: Secondary | ICD-10-CM | POA: Diagnosis not present

## 2015-08-16 DIAGNOSIS — T39312A Poisoning by propionic acid derivatives, intentional self-harm, initial encounter: Secondary | ICD-10-CM | POA: Diagnosis not present

## 2015-08-16 DIAGNOSIS — Z800 Family history of malignant neoplasm of digestive organs: Secondary | ICD-10-CM

## 2015-08-16 DIAGNOSIS — F411 Generalized anxiety disorder: Secondary | ICD-10-CM | POA: Diagnosis present

## 2015-08-16 DIAGNOSIS — Z8249 Family history of ischemic heart disease and other diseases of the circulatory system: Secondary | ICD-10-CM | POA: Diagnosis not present

## 2015-08-16 HISTORY — DX: Major depressive disorder, recurrent severe without psychotic features: F33.2

## 2015-08-16 MED ORDER — BUPROPION HCL ER (XL) 150 MG PO TB24
150.0000 mg | ORAL_TABLET | Freq: Every day | ORAL | Status: DC
  Administered 2015-08-17 – 2015-08-18 (×2): 150 mg via ORAL
  Filled 2015-08-16 (×4): qty 1

## 2015-08-16 MED ORDER — BUPROPION HCL ER (XL) 150 MG PO TB24
150.0000 mg | ORAL_TABLET | Freq: Every day | ORAL | Status: DC
  Administered 2015-08-16: 150 mg via ORAL
  Filled 2015-08-16: qty 1

## 2015-08-16 MED ORDER — ALUM & MAG HYDROXIDE-SIMETH 200-200-20 MG/5ML PO SUSP: 30.0000 mL | ORAL | Status: DC | PRN

## 2015-08-16 MED ORDER — ACETAMINOPHEN 325 MG PO TABS
650.0000 mg | ORAL_TABLET | Freq: Four times a day (QID) | ORAL | Status: DC | PRN
Start: 2015-08-16 — End: 2015-08-18

## 2015-08-16 MED ORDER — HYDROXYZINE HCL 25 MG PO TABS
25.0000 mg | ORAL_TABLET | Freq: Four times a day (QID) | ORAL | Status: DC | PRN
  Filled 2015-08-16: qty 10

## 2015-08-16 MED ORDER — ZOLPIDEM TARTRATE 5 MG PO TABS
5.0000 mg | ORAL_TABLET | Freq: Every day | ORAL | Status: DC
  Administered 2015-08-16: 5 mg via ORAL
  Filled 2015-08-16: qty 1

## 2015-08-16 MED ORDER — ACETAMINOPHEN 325 MG PO TABS
650.0000 mg | ORAL_TABLET | Freq: Four times a day (QID) | ORAL | Status: DC | PRN
  Administered 2015-08-16 (×2): 650 mg via ORAL
  Filled 2015-08-16 (×2): qty 2

## 2015-08-16 MED ORDER — ACETAMINOPHEN 325 MG PO TABS: 650.0000 mg | ORAL_TABLET | Freq: Four times a day (QID) | ORAL | Status: DC | PRN

## 2015-08-16 MED ORDER — HYDROXYZINE HCL 25 MG PO TABS: 25.0000 mg | ORAL_TABLET | Freq: Four times a day (QID) | ORAL | Status: DC | PRN

## 2015-08-16 MED ORDER — MAGNESIUM HYDROXIDE 400 MG/5ML PO SUSP: 30.0000 mL | Freq: Every day | ORAL | Status: DC | PRN

## 2015-08-16 NOTE — Progress Notes (Signed)
Adult Psychoeducational Group Note  Date:  08/16/2015 Time:  9:42 PM  Group Topic/Focus:  Wrap-Up Group:   The focus of this group is to help patients review their daily goal of treatment and discuss progress on daily workbooks.  Participation Level:  Active  Participation Quality:  Appropriate and Attentive  Affect:  Appropriate  Cognitive:  Appropriate  Insight: Good  Engagement in Group:  Engaged  Modes of Intervention:  Activity  Additional Comments:  Patient rated her day a 7. Stated she had a good day but was missing family. Goal was not to cry today but she did not meet that goal.  Natasha MeadKiara M Mara Favero 08/16/2015, 9:42 PM

## 2015-08-16 NOTE — Consult Note (Signed)
[facility] Face-to-Face Psychiatry Consult   Reason for Consult:  Suicide attempt Referring Physician:  EDP Patient Identification: Jocelyn Bridges MRN:  [id number] Principal Diagnosis: Major depressive disorder, recurrent severe without psychotic features [facility] Diagnosis:   Patient Active Problem List   Diagnosis Date Noted  . Major depressive disorder, recurrent severe without psychotic features ([place]) [F33.2] 08/16/2015    Priority: High  . Normal pregnancy [V22] 02/04/2011  . SVD (spontaneous vaginal delivery) [O80] 02/04/2011    Total Time spent with patient: 45 minutes  Subjective:   Sister J Antonucci is a 35 y.o. female patient admitted with severe depression with suicide attempt.  HPI: On admission:  35 y.o. female that presents this date with thoughts of self harm and had a plan earlier this date reporting that she took 10 Aleve PM tablets in attempt to take her life due to a recent separation with her husband of 12 years. Patient reports that she has been separated from her husband for the last four months and has recently relocated to her own apartment with her two children. Patient stated on admission, that she was hoping to eventually reunite with her husband although after a discussion today that escalated into a non physical verbal altercation, patient stated she realized that they are going to get divorced. Patient stated she felt very over whelmed and did not want to live anymore. Patient stated after she took the medication her husband found her and contacted EMS. Patient denies any SA use but did report one prior attempt in 2011 which she attributes to postpartum depression and was prescribed Zoloft for depression by her OB/GYN. Patient stated after starting the Zoloft (one month) she had feelings of self harm and tried to overdose on that medication. Patient stated at that time she did not receive any help and did not report that incident. Patient is tearful on admission and is very  regretful of her actions earlier this date. Patient states she has family support and is accompied by her mother Jocelyn Bridges [phone]). Patient states she has been suffering from depression "for a long time" and needs to get help. Patient is currently employed as a Pharmacist, hospital. Patient denies any prior inpatient/oupatient treatment and is open to voluntary admission. Collateral information from admission note Jocelyn Julian RN stated: "She is very quiet and is not speaking much.She tearfully states she took "less than 10" (ten) Aleve PM tablets this afternoon. EMS states there was initially a suggestion of an argument between the husband and wife; and the husband told EMS that he "stuck my fingers in her mouth to try to make her throw up".The paramedic states they counted the pills and that all were accounted for, so they questioned any real taking of the medication.She is in no distress. Patient denies any prior MH history or AVH. Today:  The patient is very cooperative and expressive of her stress.  Her father passed in October after a year of benign brain tumors who was not expected to die. She took care of all the arrangements and her mother who lives next door.  Jocelyn Bridges has an older brother and younger brother, the older one lives out of state.  Meanwhile, her husband's sister died in her sleep unexpectantly in September (month before her dad who he was very close to).  He and his family were dealing with their grief already.  In February, he left as he could not take it anymore and has not been of any to no help.  She has  been caring for her four children (11, 9, 5, 4) by herself and trying to care for her mother also.  Yesterday, she reached her breaking point and took 10 Aleve which she regrets today and embarrassed.  She has been on Zoloft for postpartum depression with a negative reaction; whereas, Wellbutrin worked well but once she leveled off, she stopped.  Tearful on assessment, feels "alone and  overwhelmed, want it to be over with (stress)." Remains compassionate towards her husband. Her family appears to have rallied their support and she is agreeable to inpatient care despite missing her children.  No hallucinations, homicidal ideations, and alcohol/drug use.  Past Psychiatric History: Postpartum depression  Risk to Self: Suicidal Ideation: Yes-Currently Present Suicidal Intent: No (pt states earlier today) Is patient at risk for suicide?: Yes Suicidal Plan?: No (pt had a plan earlier) Access to Means: Yes Specify Access to Suicidal Means: pt had medications What has been your use of drugs/alcohol within the last 12 months?: Denies How many times?: 1 Other Self Harm Risks: none Triggers for Past Attempts: Other (Comment) (relationship issues) Intentional Self Injurious Behavior: None Risk to Others: Homicidal Ideation: No Thoughts of Harm to Others: No Current Homicidal Intent: No Current Homicidal Plan: No Access to Homicidal Means: No Identified Victim: na History of harm to others?: No Assessment of Violence: None Noted Violent Behavior Description: none Does patient have access to weapons?: No Criminal Charges Pending?: No Does patient have a court date: No Prior Inpatient Therapy: Prior Inpatient Therapy: No Prior Therapy Dates: na Prior Therapy Facilty/Provider(s): none Reason for Treatment: na Prior Outpatient Therapy: Prior Outpatient Therapy: No Prior Therapy Dates: na Prior Therapy Facilty/Provider(s): none Reason for Treatment: na Does patient have an ACCT team?: No Does patient have Intensive In-House Services?  : No Does patient have [facility] services? : No Does patient have P4CC services?: No  Past Medical History:  Past Medical History  Diagnosis Date  . MBWGYKZL([id number] Bridges    Past Surgical History  Procedure Laterality Date  . Wisdom tooth extraction     Family History:  Family History  Problem Relation Age of Onset  .  Hypertension Mother   . Diabetes Father   . Hypertension Father   . Seizures Brother     both brothers  . Cancer Paternal Grandfather     colon  . Anesthesia problems Neg Hx   . Hypotension Neg Hx   . Malignant hyperthermia Neg Hx   . Pseudochol deficiency Neg Hx    Family Psychiatric  History: none  Social History:  History  Alcohol Use No     History  Drug Use No    Social History   Social History  . Marital Status: Married    Spouse Name: N/A  . Number of Children: N/A  . Years of Education: N/A   Social History Main Topics  . Smoking status: Never Smoker   . Smokeless tobacco: Never Used  . Alcohol Use: No  . Drug Use: No  . Sexual Activity: Yes   Other Topics Concern  . None   Social History Narrative   Additional Social History:    Allergies:   Allergies  Allergen Reactions  . Metoclopramide Hcl     Muscle tightness restlessness    Labs:  Results for orders placed or performed during the hospital encounter of 08/15/15 (from the past 48 hour(s))  Comprehensive metabolic panel     Status: Abnormal   Collection Time: 08/15/15  3:29 PM  Result Value Ref Range   Sodium 137 135 - 145 mmol/L   Potassium 4.4 3.5 - 5.1 mmol/L   Chloride 109 101 - 111 mmol/L   CO2 21 (L) 22 - 32 mmol/L   Glucose, Bld 113 (H) 65 - 99 mg/dL   BUN 16 6 - 20 mg/dL   Creatinine, Ser 0.84 0.44 - 1.00 mg/dL   Calcium 9.1 8.9 - 10.3 mg/dL   Total Protein 7.7 6.5 - 8.1 g/dL   Albumin 4.2 3.5 - 5.0 g/dL   AST 17 15 - 41 U/L   ALT 14 14 - 54 U/L   Alkaline Phosphatase 50 38 - 126 U/L   Total Bilirubin 0.8 0.3 - 1.2 mg/dL   GFR calc non Af Amer >60 >60 mL/min   GFR calc Af Amer >60 >60 mL/min    Comment: (NOTE) The eGFR has been calculated using the CKD EPI equation. This calculation has not been validated in all clinical situations. eGFR's persistently <60 mL/min signify possible Chronic Kidney Disease.    Anion gap 7 5 - 15  Ethanol     Status: None   Collection Time:  08/15/15  3:29 PM  Result Value Ref Range   Alcohol, Ethyl (B) <5 <5 mg/dL    Comment:        LOWEST DETECTABLE LIMIT FOR SERUM ALCOHOL IS 5 mg/dL FOR MEDICAL PURPOSES ONLY   Salicylate level     Status: None   Collection Time: 08/15/15  3:29 PM  Result Value Ref Range   Salicylate Lvl <1.5 2.8 - 30.0 mg/dL  Acetaminophen level     Status: Abnormal   Collection Time: 08/15/15  3:29 PM  Result Value Ref Range   Acetaminophen (Tylenol), Serum <10 (L) 10 - 30 ug/mL    Comment:        THERAPEUTIC CONCENTRATIONS VARY SIGNIFICANTLY. A RANGE OF 10-30 ug/mL MAY BE AN EFFECTIVE CONCENTRATION FOR MANY PATIENTS. HOWEVER, SOME ARE BEST TREATED AT CONCENTRATIONS OUTSIDE THIS RANGE. ACETAMINOPHEN CONCENTRATIONS >150 ug/mL AT 4 HOURS AFTER INGESTION AND >50 ug/mL AT 12 HOURS AFTER INGESTION ARE OFTEN ASSOCIATED WITH TOXIC REACTIONS.   cbc     Status: Abnormal   Collection Time: 08/15/15  3:29 PM  Result Value Ref Range   WBC 11.0 (H) 4.0 - 10.5 K/uL   RBC 4.90 3.87 - 5.11 MIL/uL   Hemoglobin 14.7 12.0 - 15.0 g/dL   HCT 42.0 36.0 - 46.0 %   MCV 85.7 78.0 - 100.0 fL   MCH 30.0 26.0 - 34.0 pg   MCHC 35.0 30.0 - 36.0 g/dL   RDW 12.8 11.5 - 15.5 %   Platelets 260 150 - 400 K/uL  I-Stat beta hCG blood, ED     Status: None   Collection Time: 08/15/15  3:50 PM  Result Value Ref Range   I-stat hCG, quantitative <5.0 <5 mIU/mL   Comment 3            Comment:   GEST. AGE      CONC.  (mIU/mL)   <=1 WEEK        5 - 50     2 WEEKS       50 - 500     3 WEEKS       100 - 10,000     4 WEEKS     1,000 - 30,000        FEMALE AND NON-PREGNANT FEMALE:     LESS THAN 5 mIU/mL  CBG monitoring, ED     Status: Abnormal   Collection Time: 08/15/15  4:02 PM  Result Value Ref Range   Glucose-Capillary 108 (H) 65 - 99 mg/dL  Rapid urine drug screen (hospital performed)     Status: None   Collection Time: 08/15/15  8:54 PM  Result Value Ref Range   Opiates NONE DETECTED NONE DETECTED   Cocaine NONE  DETECTED NONE DETECTED   Benzodiazepines NONE DETECTED NONE DETECTED   Amphetamines NONE DETECTED NONE DETECTED   Tetrahydrocannabinol NONE DETECTED NONE DETECTED   Barbiturates NONE DETECTED NONE DETECTED    Comment:        DRUG SCREEN FOR MEDICAL PURPOSES ONLY.  IF CONFIRMATION IS NEEDED FOR ANY PURPOSE, NOTIFY LAB WITHIN 5 DAYS.        LOWEST DETECTABLE LIMITS FOR URINE DRUG SCREEN Drug Class       Cutoff (ng/mL) Amphetamine      1000 Barbiturate      200 Benzodiazepine   161 Tricyclics       096 Opiates          300 Cocaine          300 THC              50     Current Facility-Administered Medications  Medication Dose Route Frequency Provider Last Rate Last Dose  . acetaminophen (TYLENOL) tablet 650 mg  650 mg Oral Q6H PRN Dara Hoyer, PA-C   650 mg at 08/16/15 0326  . zolpidem (AMBIEN) tablet 5 mg  5 mg Oral QHS Patrecia Pour, NP   5 mg at 08/15/15 2113   No current outpatient prescriptions on file.    Musculoskeletal: Strength & Muscle Tone: within normal limits Gait & Station: normal Patient leans: N/A  Psychiatric Specialty Exam: Physical Exam  Constitutional: She is oriented to person, place, and time. She appears well-developed and well-nourished.  HENT:  Head: Normocephalic.  Neck: Normal range of motion.  Respiratory: Effort normal.  Musculoskeletal: Normal range of motion.  Neurological: She is alert and oriented to person, place, and time.  Skin: Skin is warm and dry.  Psychiatric: Her speech is normal and behavior is normal. Her mood appears anxious. Cognition and memory are normal. She expresses impulsivity. She exhibits a depressed mood. She expresses suicidal ideation. She expresses suicidal plans.    Review of Systems  Constitutional: Negative.   HENT: Negative.   Eyes: Negative.   Respiratory: Negative.   Cardiovascular: Negative.   Gastrointestinal: Negative.   Genitourinary: Negative.   Musculoskeletal: Negative.   Skin: Negative.    Neurological: Negative.   Endo/Heme/Allergies: Negative.   Psychiatric/Behavioral: Positive for depression and suicidal ideas. The patient is nervous/anxious.     Blood pressure 127/81, pulse 87, temperature 98 F (36.7 C), temperature source Oral, resp. rate 18, SpO2 99 %, unknown if currently breastfeeding.There is no weight on file to calculate BMI.  General Appearance: Casual  Eye Contact:  Good  Speech:  Normal Rate  Volume:  Normal  Mood:  Anxious and Depressed  Affect:  Congruent  Thought Process:  Coherent and Descriptions of Associations: Intact  Orientation:  Full (Time, Place, and Person)  Thought Content:  Rumination  Suicidal Thoughts:  Yes.  with intent/plan  Homicidal Thoughts:  No  Memory:  Immediate;   Fair Recent;   Fair Remote;   Fair  Judgement:  Impaired  Insight:  Fair  Psychomotor Activity:  Decreased  Concentration:  Concentration: Fair and Attention  Span: Fair  Recall:  [employer] of Knowledge:  Good  Language:  Good  Akathisia:  No  Handed:  Right  AIMS (if indicated):     Assets:  Housing Leisure Time Physical Health Resilience Social Support  ADL's:  Intact  Cognition:  WNL  Sleep:        Treatment Plan Summary: Daily contact with patient to assess and evaluate symptoms and progress in treatment, Medication management and Plan major depressive disorder, recurrent, severe without psychosis:  -Crisis stabilization -Medication management:  Wellbutrin XL 150 mg daily for depression, Vistaril 25 mg every six hours PRN anxiety  -Individual counseling  Disposition: Recommend psychiatric Inpatient admission when medically cleared.  Waylan Boga, NP 08/16/2015 6:37 AM Patient seen face-to-face for psychiatric evaluation, chart reviewed and case discussed with the physician extender and developed treatment plan. Reviewed the information documented and agree with the treatment plan. Corena Pilgrim, MD

## 2015-08-16 NOTE — [facility] Counselor (Signed)
[facility], pt has been accepted to bed 402-1. CSW to do voluntary paperwork.  Evette Cristalaroline Paige Sharona Rovner, [place] Therapeutic Triage Specialist

## 2015-08-16 NOTE — ED Notes (Signed)
Jocelyn Bridges reported having a great visit with her husband.  She continues to be tearful but [facility].  "I think my family is realizing that I am at my breaking point."  She denies suicidal thoughts, "I just want all the stress to be over."  She denies HI or A/V hallucinations.  She c/o headache and it was relieved by tylenol.  She took her Wellbutrin without difficulty.  Encouraged her to participate in the activities and learn some new skills when is placed in inpatient care.  Q 15 minutes maintained for safety.

## 2015-08-16 NOTE — ED Notes (Signed)
Pt reporting a headache, physician notified, orders received,

## 2015-08-16 NOTE — Tx Team (Signed)
Initial Interdisciplinary Treatment Plan   PATIENT STRESSORS: Educational concerns Financial difficulties Legal issue Marital or family conflict Traumatic event   PATIENT STRENGTHS: Ability for insight Active sense of humor Average or above average intelligence Capable of independent living Communication skills   PROBLEM LIST: Problem List/Patient Goals  Date deferred Reason deferred Estimated date of resolution  Depression 08/16/15     Suicidal Ideation s/p suicide attempt 08/16/15     Anxiety 08/16/15                 'im a good mother" 08/16/15           Im a good wife 08/16/15            DISCHARGE CRITERIA:  Ability to meet basic life and health needs Adequate post-discharge living arrangements Improved stabilization in mood, thinking, and/or behavior Medical problems require only outpatient monitoring Motivation to continue treatment in a less acute level of care  PRELIMINARY DISCHARGE PLAN: Attend aftercare/continuing care group Outpatient therapy Participate in family therapy  PATIENT/FAMIILY INVOLVEMENT: This treatment plan has been presented to and reviewed with the patient, Jocelyn Bridges, and/or family member, .  The patient and family have been given the opportunity to ask questions and make suggestions.  [name], [name] 08/16/2015, 6:01 PM

## 2015-08-16 NOTE — Progress Notes (Signed)
Jocelyn Bridges is a 35 yo married mother of 4 who works as a Systems developerspecial ed teacher. [facility] after impulsively swallowing a handful of Aleve PM yesterday...when she " had had enough".Marland Kitchen.Marland Kitchen.She says" it all started when my father died about this time l;ast year. It was a surprise and my mother wasn't ready ( mother lives next door) and I had to take care of everything..her.the funeral.my family. The arrangements. As well as keep my family going ( I have 4 kids). Then my husband's sister up and died unexpectedly  So when he began grieving, that left me all alone to deal with my 4 kids.my siblings.my mom and then him. When he up and left me in Feb.I was undone. ". She denies any medical problems and / or allergies. Her children 25( 11 and 929 yo boys and 5 and 37 yo girls) " are my life" but its too much right now...theres no time for me" She says she did experience post partum depression after the birth of her 3rd child.that she took zoloft and that it didn;t help.that she got pregnant with 4th child and was then given wellbutrina dn she reports wellbutrin helped her. She denies any PMH of assosciating with any illegal drug use, denies tobacco usage and admits to this writer that she realizes, now, that she needs to develop new coping skills. Pt contracts for safety, admission  Completed and opt oriented to the unit.

## 2015-08-16 NOTE — Progress Notes (Signed)
Jocelyn Bridges [facility].  [facility].  VSS.  She is calm and cooperative.  [facility], all belongings and paperwork sent with patient.  Pt left the unit in no apparent distress.

## 2015-08-17 DIAGNOSIS — F332 Major depressive disorder, recurrent severe without psychotic features: Principal | ICD-10-CM

## 2015-08-17 MED ORDER — TRAZODONE HCL 50 MG PO TABS
50.0000 mg | ORAL_TABLET | Freq: Every evening | ORAL | Status: DC | PRN
  Filled 2015-08-17 (×4): qty 1

## 2015-08-17 NOTE — [facility] Group Notes (Signed)
[facility] LCSW Group Therapy 08/17/2015  1:15 pm  Type of Therapy: Group Therapy Participation Level: Active  Participation Quality: Attentive, Sharing and Supportive  Affect: Appropriate  Cognitive: Alert and Oriented  Insight: Developing/Improving and Engaged  Engagement in Therapy: Developing/Improving and Engaged  Modes of Intervention: Clarification, Confrontation, Discussion, Education, Exploration,  Limit-setting, Orientation, Problem-solving, Rapport Building, Dance movement psychotherapisteality Testing, Socialization and Support  Summary of Progress/Problems: Pt identified obstacles faced currently and processed barriers involved in overcoming these obstacles. Pt identified steps necessary for overcoming these obstacles and explored motivation (internal and external) for facing these difficulties head on. Pt further identified one area of concern in their lives and chose a goal to focus on for today. Patient identified her obstacle as taking on too many responsibilities which causes her to feel stressed and overwhelmed. Patient reports that she would like to work on communicating better with her support system.   Samuella BruinKristin Aemilia Dedrick, LCSW Clinical Social Worker [facility] [phone]

## 2015-08-17 NOTE — Progress Notes (Signed)
Patient ID: Jocelyn Bridges, female   DOB: 08/26/1980, 35 y.o.   MRN: [id number]  DAR: Pt. Denies SI/HI and A/V Hallucinations. She reports sleep is fair, appetite is fair, energy level is normal, and concentration is good. She rates depression 0/10, hopelessness 0/10, and anxiety 1/10. Patient does not speak about her overdose on her medication PTA, she appears to be minimizing as she is already asking to be discharged. She appears anxious in affect but insists, "I'm fine." Patient does not report any pain or discomfort at this time. Support and encouragement provided to the patient. Scheduled medications administered to patient per physician's orders. Patient is minimal but cooperative. She is seen in the milieu interacting with peers and attending groups. No behavioral issues noted. No PRN's needed at this time. Q15 minute checks are maintained for safety.

## 2015-08-17 NOTE — Progress Notes (Signed)
D    Pt is pleasant and appropriate   She interacts well with others and is treatment compliant   She did not require any medications from me this evening but her medications were offered and discussed A    Verbal support given   Q 15 min checks R   Pt is safe

## 2015-08-17 NOTE — Tx Team (Addendum)
Interdisciplinary Treatment Plan Update (Adult) Date: 08/17/2015    Time Reviewed: 9:30 AM  Progress in Treatment: Attending groups: Continuing to assess, patient new to milieu Participating in groups: Continuing to assess, patient new to milieu Taking medication as prescribed: Yes Tolerating medication: Yes Family/Significant other contact made: No, CSW assessing for appropriate contacts Patient understands diagnosis: Yes Discussing patient identified problems/goals with staff: Yes Medical problems stabilized or resolved: Yes Denies suicidal/homicidal ideation: Yes Issues/concerns per patient self-inventory: Yes Other:  New problem(s) identified: N/A  Discharge Plan or Barriers: CSW continuing to assess, patient new to milieu.  Reason for Continuation of Hospitalization:  Depression Anxiety Medication Stabilization   Comments: N/A  Estimated length of stay: Discharge anticipated for 08/18/15    Patient is a 35 year old female who resented to the hospital following an intentional suicide attempt by taking 10 Aleve tablets. Pt reports primary trigger(s) for admission was separation from husband, and depression/anxiety/grief related to death of father and sister-in-law in Oct. 2016. She lives with her 11 young children in [place] and plans to return at discharge. She plans to see her PCP Dr. Melford Aase for medication management and would like a therapy referral. Patient will benefit from crisis stabilization, medication evaluation, group therapy and psycho education in addition to case management for discharge planning. At discharge, it is recommended that Pt remain compliant with established discharge plan and continued treatment.   Review of initial/current patient goals per problem list:  1. Goal(s): Patient will participate in aftercare plan   Met: Yes   Target date: 3-5 days post admission date   As evidenced by: Patient will participate within aftercare plan AEB  aftercare provider and housing plan at discharge being identified.  6/19: Goal not met: CSW assessing for appropriate referrals for pt and will have follow up secured prior to d/c.  6/20: Goal met. Patient plans to return home to follow up with outpatient services    2. Goal (s): Patient will exhibit decreased depressive symptoms and suicidal ideations.   Met: Yes   Target date: 3-5 days post admission date   As evidenced by: Patient will utilize self rating of depression at 3 or below and demonstrate decreased signs of depression or be deemed stable for discharge by MD.  6/19: Goal met. Patient rates depression at 0, denies SI.    Attendees:  Patient:    Family:    Physician: Dr. Shea Evans, Dr. Einar Grad, MD  08/17/2015   Nursing: Gaylan Gerold, RN 08/17/2015   Clinical Social Worker: Erasmo Downer Jermaine Neuharth LCSW, Alvina Chou Smart LCSW 08/17/2015   Other:    Clinical: May Malachi Carl, NP 08/17/2015   Other:                                  Scribe for Treatment Team:  Tilden Fossa, [place]

## 2015-08-17 NOTE — [facility] Suicide Risk Assessment (Signed)
[facility] INPATIENT:  Family/Significant Other Suicide Prevention Education  Suicide Prevention Education:  Contact Attempts: husband Aris LotGary Ramseur [phone], (name of family member/significant other) has been identified by the patient as the family member/significant other with whom the patient will be residing, and identified as the person(s) who will aid the patient in the event of a mental health crisis.  With written consent from the patient, two attempts were made to provide suicide prevention education, prior to and/or following the patient's discharge.  We were unsuccessful in providing suicide prevention education.  A suicide education pamphlet was given to the patient to share with family/significant other.  Date and time of first attempt: 08/17/15 at 3:40pm Date and time of second attempt: 08/18/15 at 10:35am  Criss Pallone, [place]bo[place]ristin L 08/17/2015, 3:40 PM

## 2015-08-17 NOTE — H&P (Signed)
Psychiatric Admission Assessment Adult  Patient Identification: Jocelyn Bridges MRN:  [id number] Date of Evaluation:  08/17/2015 Chief Complaint:  MDD Recurrent Severe without psychotic features Principal Diagnosis: <principal problem not specified> Diagnosis:   Patient Active Problem List   Diagnosis Date Noted  . Major depressive disorder, recurrent severe without psychotic features (HCC) [F33.2] 08/16/2015  . 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 Normal pregnancy [V22] 02/04/2011  . SVD (spontaneous vaginal delivery) [O80] 02/04/2011   History of Present Illness:  Jocelyn Bridges, 35 year old, separated from husband, 4 children, employed, lives locally.  She states that she had never been hospitalized, ever been diagnosed with mental health disorder nor has she seen a mental health professional.  She states that her OB/GYN had placed her on Zoloft as she may have had post partum depression after the birth of 3rd child.  She reports that Zoloft worsened her depressed mood.  She was then placed on Wellbutrin and this she found to be more effective for her  She reports that she has undergone several stressors in her life, her sister in law died, her father who she was caring for passed away a week before she and her children moved next door to be closer to her dad.  She states that she is separated but it is an amicable arrangement.   The day she was brought in she was in an heated exchange with husband.  "I had been wanting for him to come back int he last 4 months, today I was nagging him to come back and one thing led to another and I took the pills."    Associated Signs/Symptoms: Depression Symptoms:  depressed mood, anxiety, (Hypo) Manic Symptoms:  Impulsivity, Labiality of Mood, Anxiety Symptoms:  Excessive Worry, Psychotic Symptoms:  NA PTSD Symptoms: NA Total Time spent with patient: 30 minutes  Past Psychiatric History: see HPI  Is the patient at  risk to self? Yes.    Has the patient been a risk to self in the past 6 months? Yes.    Has the patient been a risk to self within the distant past? Yes.    Is the patient a risk to others? No.  Has the patient been a risk to others in the past 6 months? No.  Has the patient been a risk to others within the distant past? No.   Prior Inpatient Therapy:   Prior Outpatient Therapy:    Alcohol Screening: 1. How often do you have a drink containing alcohol?: Never 9. Have you or someone else been injured as a result of your drinking?: No 10. Has a relative or friend or a doctor or another health worker been concerned about your drinking or suggested you cut down?: No Alcohol Use Disorder Identification Test Final Score (AUDIT): 0 Brief Intervention: AUDIT score less than 7 or less-screening does not suggest unhealthy drinking-brief intervention not indicated Substance Abuse History in the last 12 months:  NA Consequences of Substance Abuse: NA Previous Psychotropic Medications: Yes  Psychological Evaluations: Yes  Past Medical History:  Past Medical History  Diagnosis Date  . [id number](478.2Raelyn Mora    Past Surgical History  Procedure Laterality Date  . Wisdom tooth extraction     Family History:  Family History  Problem Relation Age of Onset  . Hypertension Mother   . Diabetes Father   . Hypertension Father   . Seizures Brother     both brothers  . Cancer Paternal  Grandfather     colon  . Anesthesia problems Neg Hx   . Hypotension Neg Hx   . Malignant hyperthermia Neg Hx   . Pseudochol deficiency Neg Hx    Family Psychiatric  History: see HPI Tobacco Screening: @FLOW ([phone])::1)@ Social History:  History  Alcohol Use No     History  Drug Use No    Additional Social History: Marital status: Separated Separated, when?: Feb 2017 What types of issues is patient dealing with in the relationship?: Separated from husband, wants to reconcile. Feels that they  grew apart dealing with their own grief after his sister and her fatehr died unexpectedly in Dec 31, 2014. Reports that she overdosed "to feel peace but also to get his attention" Does patient have children?: Yes How many children?: 4 How is patient's relationship with their children?: Great relationship with 4 children ages 68, 71, 32, and 11 years old    Pain Medications: n/a Prescriptions: n/a History of alcohol / drug use?: No history of alcohol / drug abuse      Allergies:   Allergies  Allergen Reactions  . Metoclopramide Hcl     Muscle tightness restlessness   Lab Results:  Results for orders placed or performed during the hospital encounter of 08/15/15 (from the past 48 hour(s))  Rapid urine drug screen (hospital performed)     Status: None   Collection Time: 08/15/15  8:54 PM  Result Value Ref Range   Opiates NONE DETECTED NONE DETECTED   Cocaine NONE DETECTED NONE DETECTED   Benzodiazepines NONE DETECTED NONE DETECTED   Amphetamines NONE DETECTED NONE DETECTED   Tetrahydrocannabinol NONE DETECTED NONE DETECTED   Barbiturates NONE DETECTED NONE DETECTED    Comment:        DRUG SCREEN FOR MEDICAL PURPOSES ONLY.  IF CONFIRMATION IS NEEDED FOR ANY PURPOSE, NOTIFY LAB WITHIN 5 DAYS.        LOWEST DETECTABLE LIMITS FOR URINE DRUG SCREEN Drug Class       Cutoff (ng/mL) Amphetamine      1000 Barbiturate      200 Benzodiazepine   200 Tricyclics       300 Opiates          300 Cocaine          300 THC              50     Blood Alcohol level:  Lab Results  Component Value Date   ETH <5 08/15/2015    Metabolic Disorder Labs:  No results found for: HGBA1C, MPG No results found for: PROLACTIN No results found for: CHOL, TRIG, HDL, CHOLHDL, VLDL, LDLCALC  Current Medications: Current Facility-Administered Medications  Medication Dose Route Frequency Provider Last Rate Last Dose  . acetaminophen (TYLENOL) tablet 650 mg  650 mg Oral Q6H PRN Charm Rings, NP      .  acetaminophen (TYLENOL) tablet 650 mg  650 mg Oral Q6H PRN Charm Rings, NP      . alum & mag hydroxide-simeth (MAALOX/MYLANTA) 200-200-20 MG/5ML suspension 30 mL  30 mL Oral Q4H PRN Charm Rings, NP      . buPROPion (WELLBUTRIN XL) 24 hr tablet 150 mg  150 mg Oral Daily Charm Rings, NP   150 mg at 08/17/15 0834  . hydrOXYzine (ATARAX/VISTARIL) tablet 25 mg  25 mg Oral Q6H PRN Charm Rings, NP      . magnesium hydroxide (MILK OF MAGNESIA) suspension 30 mL  30 mL Oral Daily  PRN Charm RingsJamison Y Lord, NP      . zolpidem Remus Loffler(AMBIEN) tablet 5 mg  5 mg Oral QHS Charm RingsJamison Y Lord, NP   5 mg at 08/16/15 2301   PTA Medications: No prescriptions prior to admission    Musculoskeletal: Strength & Muscle Tone: within normal limits Gait & Station: normal Patient leans: N/A  Psychiatric Specialty Exam: Physical Exam  Psychiatric: Her mood appears not anxious. She is not agitated, not aggressive, not hyperactive and not combative. Thought content is not paranoid. She does not exhibit a depressed mood. She expresses no homicidal and no suicidal ideation.    ROS  Blood pressure 109/70, pulse 70, temperature 98.6 F (37 C), temperature source Oral, resp. rate 18, height 5\' 9"  (1.753 m), weight 90.719 kg (200 lb), last menstrual period 07/30/2015, SpO2 100 %, not currently breastfeeding.Body mass index is 29.52 kg/(m^2).   General Appearance: Casual  Eye Contact: Fair  Speech: Normal Rate  Volume: Decreased  Mood: Depressed and Dysphoric  Affect: Constricted and Depressed  Thought Process: Coherent  Orientation: Full (Time, Place, and Person)  Thought Content: WDL  Suicidal Thoughts: No  Homicidal Thoughts: No  Memory: Immediate; Fair Recent; Fair Remote; Fair  Judgement: Fair  Insight: Shallow  Psychomotor Activity: Normal  Concentration: Concentration: Fair and Attention Span: Fair  Recall: [employer]Fair  Fund of Knowledge: Fair  Language: Fair  Akathisia:  No  Handed: Right  AIMS (if indicated):    Assets: Communication Skills Desire for Improvement Financial Resources/Insurance Housing Physical Health Social Support Vocational/Educational  ADL's: Intact  Cognition: WNL  Sleep: Number of Hours: 6.25        Treatment Plan Summary: Admit for crisis management and mood stabilization. Medication management to re-stabilize current mood symptoms Group counseling sessions for coping skills Medical consults as needed Review and reinstate any pertinent home medications for other health problems  Observation Level/Precautions:  15 minute checks  Laboratory:  per ED  Psychotherapy:  group  Medications:  Wellbutrin XL 150 mg daily.  Feels that Ambien 5 mg is ineffective and will dc.  Added Trazodone 50 mg QHS and may repeat one time.  Consultations:  As needed  Discharge Concerns:  safety  Estimated LOS:  2- 7 days  Other:     I certify that inpatient services furnished can reasonably be expected to improve the patient's condition.    [facility] Hospitalheila May Paul Trettin, NP [facility] 6/19/20174:03 PM

## 2015-08-17 NOTE — [facility] Suicide Risk Assessment (Signed)
[facility]   Nursing information obtained from:    Demographic factors:   patient is a 35 year old Caucasian female Current Mental Status: Patient is casually dressed. She is alert and oriented. Speech is normal in rate and volume. Affect is constricted. She presents with a depressed mood. Denies any psychotic symptoms. Currently declines any suicidal or homicidal ideations.   Loss Factors:   patient recently lost her father and also brother-in-law. Historical Factors:   patient has had depression previously mostly postpartum depression. Risk Reduction Factors:   patient has 4 children and has a supportive family.  Total Time spent with patient: 30 minutes Principal Problem: <principal problem not specified> Diagnosis:   Patient Active Problem List   Diagnosis Date Noted  . Major depressive disorder, recurrent severe without psychotic features (HCC) [F33.2] 08/16/2015  . 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 Normal pregnancy [V22] 02/04/2011  . SVD (spontaneous vaginal delivery) [O80] 02/04/2011   Subjective Data: Patient is a 35 year old Caucasian female admitted with depression and with the recent losses of her father and brother-in-law. She also reports that her husband left the home due to increased pressure in February and she has been feeling lonely. She denies any suicidal thoughts today.  Continued Clinical Symptoms:  Alcohol Use Disorder Identification Test Final Score (AUDIT): 0 The "Alcohol Use Disorders Identification Test", Guidelines for Use in Primary Care, Second Edition.  World Science writerHealth Organization [facility](WHO). Score between 0-7:  no or low risk or alcohol related problems. Score between 8-15:  moderate risk of alcohol related problems. Score between 16-19:  high risk of alcohol related problems. Score 20 or above:  warrants further diagnostic evaluation for alcohol dependence and treatment.   CLINICAL FACTORS:   Depression:   Anhedonia Hopelessness Impulsivity Insomnia Severe   Musculoskeletal: Strength & Muscle Tone: within normal limits Gait & Station: normal Patient leans: N/A  Psychiatric Specialty Exam: Physical Exam  ROS  Blood pressure 109/70, pulse 70, temperature 98.6 F (37 C), temperature source Oral, resp. rate 18, height 5\' 9"  (1.753 m), weight 200 lb (90.719 kg), last menstrual period 07/30/2015, SpO2 100 %, not currently breastfeeding.Body mass index is 29.52 kg/(m^2).  General Appearance: Casual  Eye Contact:  Fair  Speech:  Normal Rate  Volume:  Decreased  Mood:  Depressed and Dysphoric  Affect:  Constricted and Depressed  Thought Process:  Coherent  Orientation:  Full (Time, Place, and Person)  Thought Content:  WDL  Suicidal Thoughts:  No  Homicidal Thoughts:  No  Memory:  Immediate;   Fair Recent;   Fair Remote;   Fair  Judgement:  Fair  Insight:  Shallow  Psychomotor Activity:  Normal  Concentration:  Concentration: Fair and Attention Span: Fair  Recall:  [employer]Fair  Fund of Knowledge:  Fair  Language:  Fair  Akathisia:  No  Handed:  Right  AIMS (if indicated):     Assets:  Communication Skills Desire for Improvement Financial Resources/Insurance Housing Physical Health Social Support Vocational/Educational  ADL's:  Intact  Cognition:  WNL  Sleep:  Number of Hours: 6.25      COGNITIVE FEATURES THAT CONTRIBUTE TO RISK:  Thought constriction (tunnel vision)    SUICIDE RISK:   Mild:  Suicidal ideation of limited frequency, intensity, duration, and specificity.  There are no identifiable plans, no associated intent, mild dysphoria and related symptoms, good self-control (both objective and subjective assessment), few other risk factors, and identifiable protective factors, including available and accessible social support.  PLAN OF CARE:  Patient has been started on Wellbutrin and is doing well on that. Patient to engage in groups and develop coping  skills to deal with current stressors. Will start on trazodone at 50 mg at bedtime to help her sleep. Continue to monitor for mood and safety.  I certify that inpatient services furnished can reasonably be expected to improve the patient's condition.   Patrick North, MD 08/17/2015, 12:55 PM

## 2015-08-17 NOTE — [facility] Group Notes (Signed)
Pt attended spiritual care group on grief and loss facilitated by chaplain Kadiatou Oplinger   Group opened with brief discussion and psycho-social ed around grief and loss in relationships and in relation to self - identifying life patterns, circumstances, changes that cause losses. Established group norm of speaking from own life experience. Group goal of establishing open and affirming space for members to share loss and experience with grief, normalize grief experience and provide psycho social education and grief support.     

## 2015-08-17 NOTE — [facility] Counselor (Signed)
Adult Comprehensive Assessment  Patient ID: Jocelyn Bridges, female   DOB: 08/12/1980, 35 y.o.   MRN: [id number]  Information Source: Information source: Patient  Current Stressors:  Educational / Learning stressors: N/A Employment / Job issues: Works full time as a Pension scheme managerspecial education teacher Family Relationships: Separated from husband, wants to Magazine features editorreconcile Financial / Lack of resources (include bankruptcy): Paying on 2 mortgages which is a Merchant navy officerfinancial strain Housing / Lack of housing: Living in a home next to her mother's house with her 4 children Physical health (include injuries & life threatening diseases): Denies Social relationships: Denies Substance abuse: Denies Bereavement / Loss: father-in-law currently sick, father and sister-in-law died unexpectedly in Oct. 2016  Living/Environment/Situation:  Living Arrangements: Children Living conditions (as described by patient or guardian): Living in a home next to her mother's house with her 4 children How long has patient lived in current situation?: Since Oct. 2016 What is atmosphere in current home: Comfortable, Supportive  Family History:  Marital status: Separated Separated, when?: Feb 2017 What types of issues is patient dealing with in the relationship?: Separated from husband, wants to reconcile. Feels that they grew apart dealing with their own grief after his sister and her fatehr died unexpectedly in Oct. 2016. Reports that she overdosed "to feel peace but also to get his attention" Does patient have children?: Yes How many children?: 4 How is patient's relationship with their children?: Great relationship with 4 children ages 5611, 559, 625, and 37 years old  Childhood History:  By whom was/is the patient raised?: Both parents Description of patient's relationship with caregiver when they were a child: Close with both parents Patient's description of current relationship with people who raised him/her: close with mother who lives  next door, was close with her father who died in Oct. 2016 Does patient have siblings?: Yes Number of Siblings: 2 Description of patient's current relationship with siblings: close with 2 brothers Did patient suffer any verbal/emotional/physical/sexual abuse as a child?: No Did patient suffer from severe childhood neglect?: No Has patient ever been sexually abused/assaulted/raped as an adolescent or adult?: No Was the patient ever a victim of a crime or a disaster?: No Witnessed domestic violence?: No Has patient been effected by domestic violence as an adult?: No  Education:  Highest grade of school patient has completed: B.A. degree in early education Currently a student?: No Learning disability?: No  Employment/Work Situation:   Employment situation: Employed Where is patient currently employed?: Works full time as a Pension scheme managerspecial education teacher How long has patient been employed?: 15 years Patient's job has been impacted by current illness: No What is the longest time patient has a held a job?: current position Has patient ever been in the Eli Lilly and Companymilitary?: No  Financial Resources:   Financial resources: Income from employment, Income from spouse Does patient have a Lawyerrepresentative payee or guardian?: No  Alcohol/Substance Abuse:   What has been your use of drugs/alcohol within the last 12 months?: Denies If attempted suicide, did drugs/alcohol play a role in this?: No Alcohol/Substance Abuse Treatment Hx: Denies past history Has alcohol/substance abuse ever caused legal problems?: No  Social Support System:   Patient's Community Support System: Good Describe Community Support System: reports strong family support and several friends Type of faith/religion: Ephriam KnucklesChristian How does patient's faith help to cope with current illness?: Believes that things happen for a reason, gives her hope  Leisure/Recreation:   Leisure and Hobbies: enjoys reading non-fiction books, traveling, and  researching topics related to her  work  Strengths/Needs:   What things does the patient do well?: passion for teaching, good mother In what areas does patient struggle / problems for patient: dealing with depression/anxiety/grief related to death of father and sister-in-law, marital issues  Discharge Plan:   Does patient have access to transportation?: Yes Will patient be returning to same living situation after discharge?: Yes Currently receiving community mental health services: Yes (From Whom) (PCP Dr. Oneta Rack) If no, would patient like referral for services when discharged?: Yes (What county?) (therapy in [place]) Does patient have financial barriers related to discharge medications?: No  Summary/Recommendations:   Patient is a 35 year old female who resented to the hospital following an intentional suicide attempt by taking 10 Aleve tablets. Pt reports primary trigger(s) for admission was separation from husband, and depression/anxiety/grief related to death of father and sister-in-law in Oct. 2016. She lives with her 4 young children in [place] and plans to return at discharge. She plans to see her PCP Dr. Oneta Rack for medication management and would like a therapy referral. Patient will benefit from crisis stabilization, medication evaluation, group therapy and psycho education in addition to case management for discharge planning. At discharge, it is recommended that Pt remain compliant with established discharge plan and continued treatment.  Jocelyn Bridges, [name] 08/17/2015

## 2015-08-17 NOTE — Progress Notes (Signed)
D: Attended wrap up group.  Rated day 7/10.  Denied SI, HI, AVH.  During 1:1 with nurse states she misses her children, but says she is doing much better than when she arrived.  Says she knows her children are being taken care of at home.  This evening affect appropriate, wide ranged, mood euthymic.  Watched movie with peers in dayroom until 11PM.  Scheduled Ambien given right after group. A:  Continue on 15 minute checks for safety. R:  Remains safe on unit.  Appeared to be asleep within 30 minutes of HS med on 15 minute checks.

## 2015-08-17 NOTE — Progress Notes (Signed)
Recreation Therapy Notes  Date: 06.19.2017 Time: 9:30am Location: [address] Group Room   Group Topic: Stress Management  Goal Area(s) Addresses:  Patient will actively participate in stress management techniques presented during session.   Behavioral Response: Engaged, Attentive   Intervention: Stress management techniques  Activity :  Deep Breathing, Progressive Body Relaxation and Progressive Body Scan. LRT provided education, instruction and demonstration on practice of Deep Breathing, Progressive Body Relaxation and Progressive Body Scan. Patient was asked to participate in technique introduced during session.   Education:  Stress Management, Discharge Planning.   Education Outcome: Acknowledges education  Clinical Observations/Feedback: Patient actively engaged in technique introduced, expressed no concerns and demonstrated ability to practice independently post d/c.   Nastasha Reising L Hiedi Touchton, LRT/CTRS        Jermery Caratachea L 08/17/2015 10:19 AM 

## 2015-08-18 ENCOUNTER — Other Ambulatory Visit: Payer: Self-pay | Admitting: Internal Medicine

## 2015-08-18 DIAGNOSIS — T39312A Poisoning by propionic acid derivatives, intentional self-harm, initial encounter: Secondary | ICD-10-CM

## 2015-08-18 DIAGNOSIS — T1491 Suicide attempt: Secondary | ICD-10-CM

## 2015-08-18 MED ORDER — HYDROXYZINE HCL 25 MG PO TABS: 25.0000 mg | ORAL_TABLET | Freq: Four times a day (QID) | ORAL | Status: DC | PRN

## 2015-08-18 MED ORDER — TRAZODONE HCL 50 MG PO TABS: 50.0000 mg | ORAL_TABLET | Freq: Every evening | ORAL | Status: DC | PRN

## 2015-08-18 MED ORDER — BUPROPION HCL ER (XL) 150 MG PO TB24: 150.0000 mg | ORAL_TABLET | Freq: Every day | ORAL | Status: DC

## 2015-08-18 NOTE — Discharge Summary (Signed)
Physician Discharge Summary Note  Patient:  Jocelyn Bridges is an 35 y.o., female MRN:  [id number] DOB:  1981-01-10 Patient phone:  [phone] (home)  Patient address:   [address] [place] [address],  Total Time spent with patient: 30 minutes  Date of Admission:  08/16/2015 Date of Discharge: 08/18/2015  Reason for Admission:   Overdose of Aleve  Principal Problem: Depression, major, severe recurrence [facility] [address]) Discharge Diagnoses: Patient Active Problem List   Diagnosis Date Noted  . 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 Major depressive disorder, recurrent severe without psychotic features (HCC) [F33.2] 08/16/2015  . Normal pregnancy [V22] 02/04/2011  . SVD (spontaneous vaginal delivery) [O80] 02/04/2011    Past Psychiatric History: see HPI  Past Medical History:  Past Medical History  Diagnosis Date  . OZHYQMVH(846.9Raelyn Mora    Past Surgical History  Procedure Laterality Date  . Wisdom tooth extraction     Family History:  Family History  Problem Relation Age of Onset  . Hypertension Mother   . Diabetes Father   . Hypertension Father   . Seizures Brother     both brothers  . Cancer Paternal Grandfather     colon  . Anesthesia problems Neg Hx   . Hypotension Neg Hx   . Malignant hyperthermia Neg Hx   . Pseudochol deficiency Neg Hx    Family Psychiatric  History: see HPI Social History:  History  Alcohol Use No     History  Drug Use No    Social History   Social History  . Marital Status: Married    Spouse Name: N/A  . Number of Children: N/A  . Years of Education: N/A   Social History Main Topics  . Smoking status: Never Smoker   . Smokeless tobacco: Never Used  . Alcohol Use: No  . Drug Use: No  . Sexual Activity: Yes    Birth Control/ Protection: Other-see comments     Comment: coil   Other Topics Concern  . None   Social History Narrative   Hospital Course:  Jocelyn Bridges 34  yo, underwent several stressors in life that Jocelyn Bridges overdose on Aleve pills as a suicidal attempt.    Jocelyn Bridges was admitted for Depression, major, severe recurrence (HCC) and crisis management.  Jocelyn Bridges was treated with Wellbutrin XL 150 mg, Hydroxyzine 25 mg anxiety and Trazodone 50 mg for sleep.  Medical problems were identified and treated as needed.  Home medications were restarted as appropriate.  Improvement was monitored by observation and Jocelyn Bridges daily report of symptom reduction.  Emotional and mental status was monitored by daily self inventory reports completed by Jocelyn Bridges and clinical staff.  Patient reported continued improvement, denied any new concerns.  Patient had been compliant on medications and denied side effects.  Support and encouragement was provided.    At time of discharge, patient rated both depression and anxiety levels to be manageable and minimal.  Patient encouraged to attend groups to help with recognizing triggers of emotional crises and de-stabilizations.  Patient encouraged to attend group to help identify the positive things in life that would help in dealing with feelings of loss, depression and unhealthy or abusive tendencies.         Jocelyn Bridges was evaluated by the treatment team for stability and plans for continued recovery upon discharge.  Jocelyn Bridges was offered further treatment options upon discharge including Residential,  Intensive Outpatient and Outpatient treatment. Jocelyn Bridges will follow up with agencies listed below for medication management and counseling.  Encouraged patient to maintain satisfactory support network and home environment.  Advised to adhere to medication compliance and outpatient treatment follow up.  Prescriptions provided.       Jocelyn Bridges motivation was an integral factor for scheduling further treatment.  Employment, transportation, bed availability, health status, family support, and any pending legal issues were also  considered during her hospital stay.  Upon completion of this admission the patient was both mentally and medically stable for discharge denying suicidal/homicidal ideation, auditory/visual/tactile hallucinations, delusional thoughts and paranoia.        Physical Findings: AIMS: Facial and Oral Movements Muscles of Facial Expression: None, normal Lips and Perioral Area: None, normal Jaw: None, normal Tongue: None, normal,Extremity Movements Upper (arms, wrists, hands, fingers): None, normal Lower (legs, knees, ankles, toes): None, normal, Trunk Movements Neck, shoulders, hips: None, normal, Overall Severity Severity of abnormal movements (highest score from questions above): None, normal Incapacitation due to abnormal movements: None, normal Patient's awareness of abnormal movements (rate only patient's report): No Awareness, Dental Status Current problems with teeth and/or dentures?: No Does patient usually wear dentures?: No  CIWA:  CIWA-Ar Total: 1 COWS:  COWS Total Score: 0  Musculoskeletal: Strength & Muscle Tone: within normal limits Gait & Station: normal Patient leans: N/A  Psychiatric Specialty Exam: Physical Exam  Psychiatric: Her mood appears not anxious. Jocelyn Bridges is not agitated and not aggressive. Thought content is not paranoid. Jocelyn Bridges does not exhibit a depressed mood. Jocelyn Bridges expresses no homicidal and no suicidal ideation.    Review of Systems  Psychiatric/Behavioral: Negative for depression, suicidal ideas and hallucinations. The patient does not have insomnia.   All other systems reviewed and are negative.   Blood pressure 119/87, pulse 98, temperature 98.2 F (36.8 C), temperature source Oral, resp. rate 18, height 5\' 9"  (1.753 m), weight 90.719 kg (200 lb), last menstrual period 07/30/2015, SpO2 100 %, not currently breastfeeding.Body mass index is 29.52 kg/(m^2).    Has this patient used any form of tobacco in the last 30 days? (Cigarettes, Smokeless Tobacco, Cigars,  and/or Pipes) Yes, N/A  Blood Alcohol level:  Lab Results  Component Value Date   ETH <5 08/15/2015    Metabolic Disorder Labs:  No results found for: HGBA1C, MPG No results found for: PROLACTIN No results found for: CHOL, TRIG, HDL, CHOLHDL, VLDL, LDLCALC  See Psychiatric Specialty Exam and Suicide Risk Assessment completed by Attending Physician prior to discharge.  Discharge destination:  Home  Is patient on multiple antipsychotic therapies at discharge:  No   Has Patient had three or more failed trials of antipsychotic monotherapy by history:  No  Recommended Plan for Multiple Antipsychotic Therapies: NA     Medication List    TAKE these medications      Indication   buPROPion 150 MG 24 hr tablet  Commonly known as:  WELLBUTRIN XL  Take 1 tablet (150 mg total) by mouth daily.   Indication:  Major Depressive Disorder     hydrOXYzine 25 MG tablet  Commonly known as:  ATARAX/VISTARIL  Take 1 tablet (25 mg total) by mouth every 6 (six) hours as needed for anxiety.   Indication:  Anxiety Neurosis     traZODone 50 MG tablet  Commonly known as:  DESYREL  Take 1 tablet (50 mg total) by mouth at bedtime and may repeat dose one time if needed.   Indication:  Trouble Sleeping       Follow-up Information    [facility].   Why:  New patient appt requested as you are no longer active with this practice. Staff should contact you tomorrow 6/21 to schedule appointment for medication management services. Please call if you do not hear from office by Thursday 6/22.   Contact information:   [address] [place], [place] [address] [phone]  fax: [phone]      Follow up with [facility] On 08/20/2015.   Why:  Therapy appt with Minette Headland on Thursday June 22nd at 3pm. Please bring insurance card and completed new patient paperwork. Call office if you need to reschedule.     Contact information:   [address], [place], [place], [address] [phone]        Follow-up recommendations:  Activity:  as tol Diet:  as tol  Comments:  1.  Take all your medications as prescribed.   2.  Report any adverse side effects to outpatient provider. 3.  Patient instructed to not use alcohol or illegal drugs while on prescription medicines. 4.  In the event of worsening symptoms, instructed patient to call 911, the crisis hotline or go to nearest emergency room for evaluation of symptoms.  Signed: Lindwood Qua, NP [facility] 08/18/2015, 10:39 AM

## 2015-08-18 NOTE — Progress Notes (Signed)
Discharge note: Pt received both written and verbal discharge instructions. Pt verbalized understanding of discharge instructions. Pt agreed to f/u appt and med regimen. Pt received prescriptions, sample meds, SRA, AVS and belongings from room. Pt safely discharge to lobby and picked up by her husband.

## 2015-08-18 NOTE — Progress Notes (Signed)

## 2015-08-18 NOTE — Progress Notes (Signed)
  [facility] :  Will you be returning to the same living situation after discharge:  Yes,  patient plans to return home At discharge, do you have transportation home?: Yes,  family Do you have the ability to pay for your medications: Yes,  patient will be provided with prescriptions at discharge  Release of information consent forms completed and in the chart;  Patient's signature needed at discharge.  Patient to Follow up at: Follow-up Information    Follow up with [facility].   Why:  New patient appt requested as you are no longer active with this practice. Staff should contact you tomorrow 6/21 to schedule appointment for medication management services. Please call if you do not hear from office by Thursday 6/22.   Contact information:   [address] [place], [place] [address] [phone]  fax: [phone]      Follow up with [facility] On 08/20/2015.   Why:  Therapy appt with Minette HeadlandSarah Dehart Young on Thursday June 22nd at 3pm. Please bring insurance card and completed new patient paperwork. Call office if you need to reschedule.    Contact information:   [address], [place], [place], [id number] [phone]        Next level of care provider has access to [facility] Link:no  Safety Planning and Suicide Prevention discussed: Yes,  with patient     Has patient been referred to the Quitline?: Patient refused referral  Patient has been referred for addiction treatment: Yes  Trysta Showman, [place] CarboKristin L 08/18/2015, 10:37 AM

## 2015-08-18 NOTE — [facility] Suicide Risk Assessment (Signed)
[facility]   Principal Problem: <principal problem not specified> Discharge Diagnoses:  Patient Active Problem List   Diagnosis Date Noted  . Major depressive disorder, recurrent severe without psychotic features (HCC) [F33.2] 08/16/2015  . 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 Normal pregnancy [V22] 02/04/2011  . SVD (spontaneous vaginal delivery) [O80] 02/04/2011    Total Time spent with patient: 15 minutes  Musculoskeletal: Strength & Muscle Tone: within normal limits Gait & Station: normal Patient leans: N/A  Psychiatric Specialty Exam: ROS  Blood pressure 119/87, pulse 98, temperature 98.2 F (36.8 C), temperature source Oral, resp. rate 18, height 5\' 9"  (1.753 m), weight 200 lb (90.719 kg), last menstrual period 07/30/2015, SpO2 100 %, not currently breastfeeding.Body mass index is 29.52 kg/(m^2).  General Appearance: Casual  Eye Contact::  Fair  Speech:  Clear and Coherent409  Volume:  Normal  Mood:  Euthymic  Affect:  Congruent  Thought Process:  Coherent  Orientation:  Full (Time, Place, and Person)  Thought Content:  WDL  Suicidal Thoughts:  No  Homicidal Thoughts:  No  Memory:  Immediate;   Fair Recent;   Fair Remote;   Fair  Judgement:  Fair  Insight:  Fair  Psychomotor Activity:  Normal  Concentration:  Fair  Recall:  FiservFair  Fund of Knowledge:Fair  Language: Fair  Akathisia:  No  Handed:  Right  AIMS (if indicated):     Assets:  Communication Skills Desire for Improvement Housing Physical Health Resilience Social Support Vocational/Educational  Sleep:  Number of Hours: 6.25  Cognition: WNL  ADL's:  Intact   Mental Status Per Nursing Assessment::   On Admission:     Demographic Factors:  Caucasian, married, employed  Loss Factors: Loss of significant relationship  Historical Factors: NA  Risk Reduction Factors:   Responsible for children under 37 years of age, Sense of  responsibility to family, Employed, Living with another person, especially a relative, Positive social support, Positive therapeutic relationship and Positive coping skills or problem solving skills  Continued Clinical Symptoms:  Significantly improved mood.  Cognitive Features That Contribute To Risk:  Thought constriction (tunnel vision)    Suicide Risk:  Minimal: No identifiable suicidal ideation.  Patients presenting with no risk factors but with morbid ruminations; may be classified as minimal risk based on the severity of the depressive symptoms    Plan Of Care/Follow-up recommendations:  Activity:  Normal Diet:  Normal  Patient to follow up with discharge instructions. Patient to take medications per discharge instructions. Patient aware of safety plan if she has suicidal thoughts.  Patrick NorthAVI, Titilayo Hagans, MD 08/18/2015, 9:39 AM

## 2019-06-27 ENCOUNTER — Ambulatory Visit (INDEPENDENT_AMBULATORY_CARE_PROVIDER_SITE_OTHER): Payer: Self-pay

## 2019-06-27 ENCOUNTER — Other Ambulatory Visit: Payer: Self-pay

## 2019-06-27 ENCOUNTER — Ambulatory Visit
Admission: EM | Admit: 2019-06-27 | Discharge: 2019-06-27 | Disposition: A | Discharge status: Home / Self Care | Payer: Self-pay | Attending: Physician Assistant | Admitting: Physician Assistant

## 2019-06-27 DIAGNOSIS — R05 Cough: Secondary | ICD-10-CM

## 2019-06-27 DIAGNOSIS — R059 Cough, unspecified: Secondary | ICD-10-CM

## 2019-06-27 DIAGNOSIS — R5081 Fever presenting with conditions classified elsewhere: Secondary | ICD-10-CM

## 2019-06-27 DIAGNOSIS — Z20822 Contact with and (suspected) exposure to covid-19: Secondary | ICD-10-CM

## 2019-06-27 DIAGNOSIS — R509 Fever, unspecified: Secondary | ICD-10-CM

## 2019-06-27 DIAGNOSIS — R112 Nausea with vomiting, unspecified: Secondary | ICD-10-CM

## 2019-06-27 DIAGNOSIS — R197 Diarrhea, unspecified: Secondary | ICD-10-CM

## 2019-06-27 MED ORDER — ONDANSETRON 4 MG PO TBDP: 4.0000 mg | ORAL_TABLET | Freq: Three times a day (TID) | ORAL | 0 refills | Status: DC | PRN

## 2019-06-27 MED ORDER — BENZONATATE 200 MG PO CAPS: 200.0000 mg | ORAL_CAPSULE | Freq: Three times a day (TID) | ORAL | 0 refills | Status: DC

## 2019-06-27 MED ORDER — FLUTICASONE PROPIONATE 50 MCG/ACT NA SUSP: 2.0000 | Freq: Every day | NASAL | 0 refills | Status: DC

## 2019-06-27 NOTE — Discharge Instructions (Addendum)
Chest xray negative. COVID PCR testing ordered. I would like you to quarantine until testing results. Tessalon for cough. Flonase for congestion/drainage. Zofran for nausea/vomiting. Tylenol/motrin for pain and fever. Keep hydrated, urine should be clear to pale yellow in color. If experiencing shortness of breath, trouble breathing, go to the emergency department for further evaluation needed.

## 2019-06-27 NOTE — ED Triage Notes (Signed)
Pt c/o fatigue since Sunday and on Monday developed fever, cough, sore throat, nasal congested. States on Tuesday loss tastes and smell. States on Wednesday night developed N/V/D.

## 2019-06-27 NOTE — ED Provider Notes (Signed)
[facility]    CSN: [id number] Arrival date & time: 06/27/19  1023      History   Chief Complaint Chief Complaint  Patient presents with  . Fever    HPI Jocelyn Bridges is a 39 y.o. female.   39 year old female comes in for 5 day of URI symptoms. Has had cough, sore throat, nasal congestion. Fever with tmax 102.8, responsive to antipyretic. Has had nausea with 3 episodes of NBNB vomit. 3 episodes of diarrhea. Denies melena, hematochezia. Denies abdominal pain. Has loss of taste/smell. Denies shortness of breath. Never smoker.      Past Medical History:  Diagnosis Date  . GGEZMOQH(476.5)    miagraine    Patient Active Problem List   Diagnosis Date Noted  . Major depressive disorder, recurrent severe without psychotic features ([place]) 08/16/2015  . Depression, major, severe recurrence ([place]) 08/16/2015  . Normal pregnancy 02/04/2011  . SVD (spontaneous vaginal delivery) 02/04/2011    Past Surgical History:  Procedure Laterality Date  . WISDOM TOOTH EXTRACTION      OB History    Gravida  4   Para  4   Term  4   Preterm      AB      Living  4     SAB      TAB      Ectopic      Multiple      Live Births  4            Home Medications    Prior to Admission medications   Medication Sig Start Date End Date Taking? Authorizing Provider  benzonatate (TESSALON) 200 MG capsule Take 1 capsule (200 mg total) by mouth every 8 (eight) hours. 06/27/19   Tasia Catchings, Linnet Bottari V, PA-C  fluticasone (FLONASE) 50 MCG/ACT nasal spray Place 2 sprays into both nostrils daily. 06/27/19   Tasia Catchings, Meredeth Furber V, PA-C  ondansetron (ZOFRAN ODT) 4 MG disintegrating tablet Take 1 tablet (4 mg total) by mouth every 8 (eight) hours as needed for nausea or vomiting. 06/27/19   Ok Edwards, PA-C    Family History Family History  Problem Relation Age of Onset  . Hypertension Mother   . Diabetes Father   . Hypertension Father   . Seizures Brother        both brothers  . Cancer Paternal  Grandfather        colon  . Anesthesia problems Neg Hx   . Hypotension Neg Hx   . Malignant hyperthermia Neg Hx   . Pseudochol deficiency Neg Hx     Social History Social History   Tobacco Use  . Smoking status: Never Smoker  . Smokeless tobacco: Never Used  Substance Use Topics  . Alcohol use: No  . Drug use: No     Allergies   Metoclopramide hcl   Review of Systems Review of Systems  Reason unable to perform ROS: See HPI as above.     Physical Exam Triage Vital Signs ED Triage Vitals  Enc Vitals Group     BP 06/27/19 1035 (!) 129/91     Pulse Rate 06/27/19 1035 94     Resp 06/27/19 1035 16     Temp 06/27/19 1035 99 F (37.2 C)     Temp Source 06/27/19 1035 Oral     SpO2 06/27/19 1035 98 %     Weight --      Height --      Head Circumference --  Peak Flow --      Pain Score 06/27/19 1039 0     Pain Loc --      Pain Edu? --      Excl. in GC? --    No data found.  Updated Vital Signs BP (!) 129/91 (BP Location: Left Arm)   Pulse 94   Temp 99 F (37.2 C) (Oral)   Resp 16   LMP 05/25/2019   SpO2 98%   Physical Exam Constitutional:      General: She is not in acute distress.    Appearance: Normal appearance. She is not ill-appearing, toxic-appearing or diaphoretic.  HENT:     Head: Normocephalic and atraumatic.     Mouth/Throat:     Mouth: Mucous membranes are moist.     Pharynx: Oropharynx is clear. Uvula midline.  Cardiovascular:     Rate and Rhythm: Normal rate and regular rhythm.     Heart sounds: Normal heart sounds. No murmur. No friction rub. No gallop.   Pulmonary:     Effort: Pulmonary effort is normal. No accessory muscle usage, prolonged expiration, respiratory distress or retractions.     Comments: Left lower base crackles that is slightly improved after cough. Musculoskeletal:     Cervical back: Normal range of motion and neck supple.  Neurological:     General: No focal deficit present.     Mental Status: She is alert and  oriented to person, place, and time.      UC Treatments / Results  Labs (all labs ordered are listed, but only abnormal results are displayed) Labs Reviewed  NOVEL CORONAVIRUS, NAA    EKG   Radiology DG Chest 2 View  Result Date: 06/27/2019 CLINICAL DATA:  Fever and cough, suspect COVID EXAM: CHEST - 2 VIEW COMPARISON:  None. FINDINGS: Normal heart size and mediastinal contours. No acute infiltrate or edema. No effusion or pneumothorax. No acute osseous findings. IMPRESSION: No visible pneumonia. Electronically Signed   By: Marnee Spring M.D.   On: 06/27/2019 11:26    Procedures Procedures (including critical care time)  Medications Ordered in UC Medications - No data to display  Initial Impression / Assessment and Plan / UC Course  I have reviewed the triage vital signs and the nursing notes.  Pertinent labs & imaging results that were available during my care of the patient were reviewed by me and considered in my medical decision making (see chart for details).    Given fever, left lower lobe crackles, will obtain CXR for further evaluation.  Chest x-ray negative for pneumonia.  Covid testing ordered, patient to quarantine until testing results return.  Symptomatic treatment discussed.  Return precautions given.  Patient expresses understanding and agrees to plan.  Final Clinical Impressions(s) / UC Diagnoses   Final diagnoses:  Suspected COVID-19 virus infection  Cough  Fever, unspecified  Nausea vomiting and diarrhea   ED Prescriptions    Medication Sig Dispense Auth. Provider   ondansetron (ZOFRAN ODT) 4 MG disintegrating tablet Take 1 tablet (4 mg total) by mouth every 8 (eight) hours as needed for nausea or vomiting. 20 tablet Jasai Sorg V, PA-C   benzonatate (TESSALON) 200 MG capsule Take 1 capsule (200 mg total) by mouth every 8 (eight) hours. 21 capsule Malique Driskill V, PA-C   fluticasone (FLONASE) 50 MCG/ACT nasal spray Place 2 sprays into both nostrils daily. 1  g Belinda Fisher, PA-C     PDMP not reviewed this encounter.   Belinda Fisher,  PA-C 06/27/19 1155

## 2019-06-28 LAB — NOVEL CORONAVIRUS, NAA: SARS-CoV-2, NAA: DETECTED — AB

## 2019-06-28 LAB — SARS-COV-2, NAA 2 DAY TAT

## 2019-06-30 ENCOUNTER — Telehealth: Payer: Medicaid Other | Admitting: Family

## 2019-06-30 DIAGNOSIS — Z20822 Contact with and (suspected) exposure to covid-19: Secondary | ICD-10-CM

## 2019-06-30 MED ORDER — PROMETHAZINE-DM 6.25-15 MG/5ML PO SYRP: 5.0000 mL | ORAL_SOLUTION | Freq: Four times a day (QID) | ORAL | 0 refills | Status: DC | PRN

## 2019-06-30 NOTE — Progress Notes (Signed)
E-Visit for Corona Virus Screening  Your current symptoms could be consistent with the coronavirus.  Many health care providers can now test patients at their office but not all are.  [facility] has multiple testing sites. For information on our COVID testing locations and hours go to [email or web address]  We are enrolling you in our MyChart Home Monitoring for COVID19 . Daily you will receive a questionnaire within the MyChart website. Our COVID 19 response team will be monitoring your responses daily.  Testing Information: The COVID-19 Community Testing sites will begin testing BY APPOINTMENT ONLY.  You can schedule online at [email or web address]  If you do not have access to a smart phone or computer you may call [phone] for an appointment.   Additional testing sites in the Community:  . For [facility] Testing sites in [place]  https://www.cvs.com/minuteclinic/covid-19-testing  . For Pop-up testing sites in   https://covid19.ncdhhs.gov/about-covid-19/testing/find-my-testing-place/pop-testing-sites  . For Testing sites with regular hours https://onsms.org/[place]/  . For [place] [facility] https://[facility].com/covid-19-community-outreach-testing/  . For [facility] https://[place].gov[place]/human-services/health-department/coronavirus-covid-19-info/covid-19-testing  . For [place] testing in [place] and [place] https://www.[place].gov/[place]/human-services/health-department/coronavirus-covid-19-info/covid-19-testing  . For [facility] testing in [place]   https://lhi.care/covidtesting  For  more information about community testing call [phone]   Please quarantine yourself while awaiting your test results. Please stay home for a minimum of 10 days from the first day of illness with improving symptoms and you have had 24 hours of no fever (without the use of Tylenol (Acetaminophen)  Motrin (Ibuprofen) or any fever reducing medication).  Also - Do not get tested prior to returning to work because once you have had a positive test the test can stay positive for more then a month in some cases.   You should wear a mask or cloth face covering over your nose and mouth if you must be around other people or animals, including pets (even at home). Try to stay at least 6 feet away from other people. This will protect the people around you.  Please continue good preventive care measures, including:  frequent hand-washing, avoid touching your face, cover coughs/sneezes, stay out of crowds and keep a 6 foot distance from others.  COVID-19 is a respiratory illness with symptoms that are similar to the flu. Symptoms are typically mild to moderate, but there have been cases of severe illness and death due to the virus.   The following symptoms may appear 2-14 days after exposure: . Fever . Cough . Shortness of breath or difficulty breathing . Chills . Repeated shaking with chills . Muscle pain . Headache . Sore throat . New loss of taste or smell . Fatigue . Congestion or runny nose . Nausea or vomiting . Diarrhea  Go to the nearest hospital ED for assessment if fever/cough/breathlessness are severe or illness seems like a threat to life.  It is vitally important that if you feel that you have an infection such as this virus or any other virus that you stay home and away from places where you may spread it to others.  You should avoid contact with people age 65 and older.   You can use medication such as A prescription cough medication called Phenergan DM 6.25 mg/15 mg. You make take one teaspoon / 5 ml every 4-6 hours as needed for cough  You may also take acetaminophen (Tylenol) as needed for fever.  Reduce your risk of any infection by using the same precautions used for avoiding the common cold or flu:  .   Wash your hands often with soap and warm water for at least 20 seconds.  If  soap and water are not readily available, use an alcohol-based hand sanitizer with at least 60% alcohol.  . If coughing or sneezing, cover your mouth and nose by coughing or sneezing into the elbow areas of your shirt or coat, into a tissue or into your sleeve (not your hands). . Avoid shaking hands with others and consider head nods or verbal greetings only. . Avoid touching your eyes, nose, or mouth with unwashed hands.  . Avoid close contact with people who are sick. . Avoid places or events with large numbers of people in one location, like concerts or sporting events. . Carefully consider travel plans you have or are making. . If you are planning any travel outside or inside the [place], visit the CDC's Travelers' Health webpage for the latest health notices. . If you have some symptoms but not all symptoms, continue to monitor at home and seek medical attention if your symptoms worsen. . If you are having a medical emergency, call 911.  HOME CARE . Only take medications as instructed by your medical team. . Drink plenty of fluids and get plenty of rest. . A steam or ultrasonic humidifier can help if you have congestion.   GET HELP RIGHT AWAY IF YOU HAVE EMERGENCY WARNING SIGNS** FOR COVID-19. If you or someone is showing any of these signs seek emergency medical care immediately. Call 911 or proceed to your closest emergency facility if: . You develop worsening high fever. . Trouble breathing . Bluish lips or face . Persistent pain or pressure in the chest . New confusion . Inability to wake or stay awake . You cough up blood. . Your symptoms become more severe  **This list is not all possible symptoms. Contact your medical provider for any symptoms that are sever or concerning to you.  MAKE SURE YOU   Understand these instructions.  Will watch your condition.  Will get help right away if you are not doing well or get worse.  Your e-visit answers were reviewed by a board  certified advanced clinical practitioner to complete your personal care plan.  Depending on the condition, your plan could have included both over the counter or prescription medications.  If there is a problem please reply once you have received a response from your provider.  Your safety is important to us.  If you have drug allergies check your prescription carefully.    You can use MyChart to ask questions about today's visit, request a non-urgent call back, or ask for a work or school excuse for 24 hours related to this e-Visit. If it has been greater than 24 hours you will need to follow up with your provider, or enter a new e-Visit to address those concerns. You will get an e-mail in the next two days asking about your experience.  I hope that your e-visit has been valuable and will speed your recovery. Thank you for using e-visits.   Greater than 5 minutes, yet less than 10 minutes of time have been spent researching, coordinating, and implementing care for this patient today.  Thank you for the details you included in the comment boxes. Those details are very helpful in determining the best course of treatment for you and help us to provide the best care.  

## 2019-07-01 ENCOUNTER — Emergency Department (HOSPITAL_COMMUNITY)
Admission: EM | Admit: 2019-07-01 | Discharge: 2019-07-01 | Disposition: A | Discharge status: Home / Self Care | Payer: HRSA Program | Attending: Emergency Medicine | Admitting: Emergency Medicine

## 2019-07-01 ENCOUNTER — Encounter (HOSPITAL_COMMUNITY): Payer: Self-pay

## 2019-07-01 ENCOUNTER — Emergency Department (HOSPITAL_COMMUNITY): Payer: HRSA Program

## 2019-07-01 DIAGNOSIS — R778 Other specified abnormalities of plasma proteins: Secondary | ICD-10-CM | POA: Insufficient documentation

## 2019-07-01 DIAGNOSIS — R7309 Other abnormal glucose: Secondary | ICD-10-CM | POA: Insufficient documentation

## 2019-07-01 DIAGNOSIS — I471 Supraventricular tachycardia: Secondary | ICD-10-CM | POA: Insufficient documentation

## 2019-07-01 DIAGNOSIS — U071 COVID-19: Secondary | ICD-10-CM | POA: Insufficient documentation

## 2019-07-01 DIAGNOSIS — R0602 Shortness of breath: Secondary | ICD-10-CM | POA: Diagnosis present

## 2019-07-01 DIAGNOSIS — J1282 Pneumonia due to coronavirus disease 2019: Secondary | ICD-10-CM | POA: Diagnosis not present

## 2019-07-01 DIAGNOSIS — Z79899 Other long term (current) drug therapy: Secondary | ICD-10-CM | POA: Diagnosis not present

## 2019-07-01 LAB — CBC
HCT: 45.3 % (ref 36.0–46.0)
Hemoglobin: 14.9 g/dL (ref 12.0–15.0)
MCH: 30.5 pg (ref 26.0–34.0)
MCHC: 32.9 g/dL (ref 30.0–36.0)
MCV: 92.6 fL (ref 80.0–100.0)
Platelets: 121 10*3/uL — ABNORMAL LOW (ref 150–400)
RBC: 4.89 MIL/uL (ref 3.87–5.11)
RDW: 12.5 % (ref 11.5–15.5)
WBC: 4.4 10*3/uL (ref 4.0–10.5)
nRBC: 0 % (ref 0.0–0.2)

## 2019-07-01 LAB — BASIC METABOLIC PANEL
Anion gap: 10 (ref 5–15)
BUN: 9 mg/dL (ref 6–20)
CO2: 22 mmol/L (ref 22–32)
Calcium: 7.8 mg/dL — ABNORMAL LOW (ref 8.9–10.3)
Chloride: 110 mmol/L (ref 98–111)
Creatinine, Ser: 1.07 mg/dL — ABNORMAL HIGH (ref 0.44–1.00)
GFR calc Af Amer: >60 mL/min (ref >60)
GFR calc non Af Amer: >60 mL/min (ref >60)
Glucose, Bld: 143 mg/dL — ABNORMAL HIGH (ref 70–99)
Potassium: 4 mmol/L (ref 3.5–5.1)
Sodium: 142 mmol/L (ref 135–145)

## 2019-07-01 LAB — TROPONIN I (HIGH SENSITIVITY): Troponin I (High Sensitivity): 155 ng/L (ref <18)

## 2019-07-01 MED ORDER — SODIUM CHLORIDE 0.9% FLUSH
3.0000 mL | Freq: Once | INTRAVENOUS | Status: AC
  Administered 2019-07-01: 03:00:00 3 mL via INTRAVENOUS

## 2019-07-01 NOTE — Discharge Instructions (Addendum)
Continue your treatment for COVID-19.  Should you have any more problems with your heart rhythm or worsening shortness of breath, then you are welcome to return to the emergency department for further evaluation.  Your blood glucose was slightly elevated today.  This will need to be followed to make sure your not in the process of developing diabetes.  However, there is no treatment that is necessary at this time.

## 2019-07-01 NOTE — ED Provider Notes (Signed)
[facility] EMERGENCY DEPARTMENT Provider Note   CSN: [id number] Arrival date & time: 07/01/19  0305   History Chief Complaint  Patient presents with  . Chest Pain  . Dizziness  . Shortness of Breath    Jocelyn Bridges is a 39 y.o. female.  The history is provided by the patient.  Chest Pain Associated symptoms: dizziness and shortness of breath   Dizziness Associated symptoms: chest pain and shortness of breath   Shortness of Breath Associated symptoms: chest pain   She has history of depression and was diagnosed with COVID-19 4 days ago and comes in because of an episode of her heart racing.  Heart started racing uncontrollably this evening.  There is associated pain in the right upper chest as well as dizziness and feeling generally weak.  There was nausea but no vomiting.  She denies any diaphoresis.  EMS was called and noted that she was in a supraventricular tachycardia which resolved after getting dose of adenosine 12mg .  Patient states that she is back to normal.  She has not run a fever for the last 2 days.  Today, she did note that her cough became harsher and she called her primary care provider who phoned in a prescription for her cough.  Past Medical History:  Diagnosis Date  . [id number](532.9)    miagraine    Patient Active Problem List   Diagnosis Date Noted  . Major depressive disorder, recurrent severe without psychotic features ([place]) 08/16/2015  . Depression, major, severe recurrence ([place]) 08/16/2015  . Normal pregnancy 02/04/2011  . SVD (spontaneous vaginal delivery) 02/04/2011    Past Surgical History:  Procedure Laterality Date  . WISDOM TOOTH EXTRACTION       OB History    Gravida  4   Para  4   Term  4   Preterm      AB      Living  4     SAB      TAB      Ectopic      Multiple      Live Births  4           Family History  Problem Relation Age of Onset  . Hypertension Mother   . Diabetes Father   .  Hypertension Father   . Seizures Brother        both brothers  . Cancer Paternal Grandfather        colon  . Anesthesia problems Neg Hx   . Hypotension Neg Hx   . Malignant hyperthermia Neg Hx   . Pseudochol deficiency Neg Hx     Social History   Tobacco Use  . Smoking status: Never Smoker  . Smokeless tobacco: Never Used  Substance Use Topics  . Alcohol use: No  . Drug use: No    Home Medications Prior to Admission medications   Medication Sig Start Date End Date Taking? Authorizing Provider  benzonatate (TESSALON) 200 MG capsule Take 1 capsule (200 mg total) by mouth every 8 (eight) hours. 06/27/19   Tasia Catchings, Amy V, PA-C  fluticasone (FLONASE) 50 MCG/ACT nasal spray Place 2 sprays into both nostrils daily. 06/27/19   Tasia Catchings, Amy V, PA-C  ondansetron (ZOFRAN ODT) 4 MG disintegrating tablet Take 1 tablet (4 mg total) by mouth every 8 (eight) hours as needed for nausea or vomiting. 06/27/19   Tasia Catchings, Amy V, PA-C  promethazine-dextromethorphan (PROMETHAZINE-DM) 6.25-15 MG/5ML syrup Take 5 mLs by mouth 4 (four) times  daily as needed. 06/30/19   Worthy Rancher B, FNP    Allergies    Metoclopramide hcl  Review of Systems   Review of Systems  Respiratory: Positive for shortness of breath.   Cardiovascular: Positive for chest pain.  Neurological: Positive for dizziness.  All other systems reviewed and are negative.   Physical Exam Updated Vital Signs BP 109/79 (BP Location: Left Arm)   Pulse (!) 111   Temp 99.2 F (37.3 C) (Oral)   Resp (!) 22   Ht 5\' 9"  (1.753 m)   Wt 89.8 kg   SpO2 95%   BMI 29.24 kg/m   Physical Exam Vitals and nursing note reviewed.   39 year old female, resting comfortably and in no acute distress. Vital signs are significant for mildly elevated heart rate and respiratory rate. Oxygen saturation is 95%, which is normal. Head is normocephalic and atraumatic. PERRLA, EOMI. Oropharynx is clear. Neck is nontender and supple without adenopathy or JVD. Back is  nontender and there is no CVA tenderness. Lungs are clear without rales, wheezes, or rhonchi. Chest is nontender. Heart has regular rate and rhythm without murmur. Abdomen is soft, flat, nontender without masses or hepatosplenomegaly and peristalsis is normoactive. Extremities have no cyanosis or edema, full range of motion is present. Skin is warm and dry without rash. Neurologic: Mental status is normal, cranial nerves are intact, there are no motor or sensory deficits.  ED Results / Procedures / Treatments   Labs (all labs ordered are listed, but only abnormal results are displayed) Labs Reviewed  BASIC METABOLIC PANEL  CBC  TROPONIN I (HIGH SENSITIVITY)    EKG EKG Interpretation  Date/Time:  Monday Jul 01 2019 03:07:47 EDT Ventricular Rate:  114 PR Interval:    QRS Duration: 98 QT Interval:  328 QTC Calculation: 452 R Axis:   -17 Text Interpretation: Sinus tachycardia Borderline left axis deviation RSR' in V1 or V2, right VCD or RVH When compared with ECG of 08/15/2015, HEART RATE has increased Confirmed by 08/17/2015 (Dione Booze) on 07/01/2019 3:18:44 AM   Radiology No results found.  Procedures Procedures   Medications Ordered in ED Medications  sodium chloride flush (NS) 0.9 % injection 3 mL (3 mLs Intravenous Given 07/01/19 0315)    ED Course  I have reviewed the triage vital signs and the nursing notes.  Pertinent labs & imaging results that were available during my care of the patient were reviewed by me and considered in my medical decision making (see chart for details).  MDM Rules/Calculators/A&P Paroxysmal supraventricular tachycardia.  This occurred in the setting of COVID-19 infection.  Old records are reviewed confirming recent urgent care visit at which time test for COVID-19 was positive.  She had received a prescription for promethazine DM earlier today, but I do not see her on any medications that are stimulants.  Chest x-ray does show developing  multifocal infiltrates consistent with COVID-19 infection.  ECG is unremarkable.  Labs showed glucose of 143, which will need to be followed as an outpatient.  Troponin is elevated to 155 and felt to represent demand ischemia.  Patient is symptom-free, I do not see an indication for repeat troponin.  Patient is discharged with instructions to continue COVID-19 treatment.  Strict return precautions are given.  Reyes BORGHILD THAKER was evaluated in Emergency Department on 07/01/2019 for the symptoms described in the history of present illness. She was evaluated in the context of the global COVID-19 pandemic, which necessitated consideration that the patient might  be at risk for infection with the SARS-CoV-2 virus that causes COVID-19. Institutional protocols and algorithms that pertain to the evaluation of patients at risk for COVID-19 are in a state of rapid change based on information released by regulatory bodies including the CDC and federal and state organizations. These policies and algorithms were followed during the patient's care in the ED.  Final Clinical Impression(s) / ED Diagnoses Final diagnoses:  None    Rx / DC Orders ED Discharge Orders    None       Dione Booze, MD 07/01/19 [phone]

## 2019-07-01 NOTE — ED Notes (Signed)
Patient verbalizes understanding of discharge instructions. Opportunity for questioning and answers were provided. Armband removed by staff, pt discharged from ED. Pt. ambulatory and discharged home.  

## 2019-07-01 NOTE — ED Triage Notes (Signed)
Pt came in [facility] called out for [facility], CP, and Dizziness. Pt tested positive of COVID around 3 days ago. Her symptoms started last Sunday. When EMS arrived pt was in SVT with a rate of 210. 6mg  Adenosine was given IV, with no rate change. 12mg  Adenosine was then given with a rate change to 108. Pt is not complaining of CP/SOB/SDizziness at this time.  20GIV LAC 20GIV R Hand  NS

## 2020-08-01 IMAGING — DX DG CHEST 2V
2 series · 2 of 2 positions shown · non-contrast
Comparison: None.

CLINICAL DATA: Fever and cough, suspect COVID

EXAM:
CHEST - 2 VIEW

[chest pa]
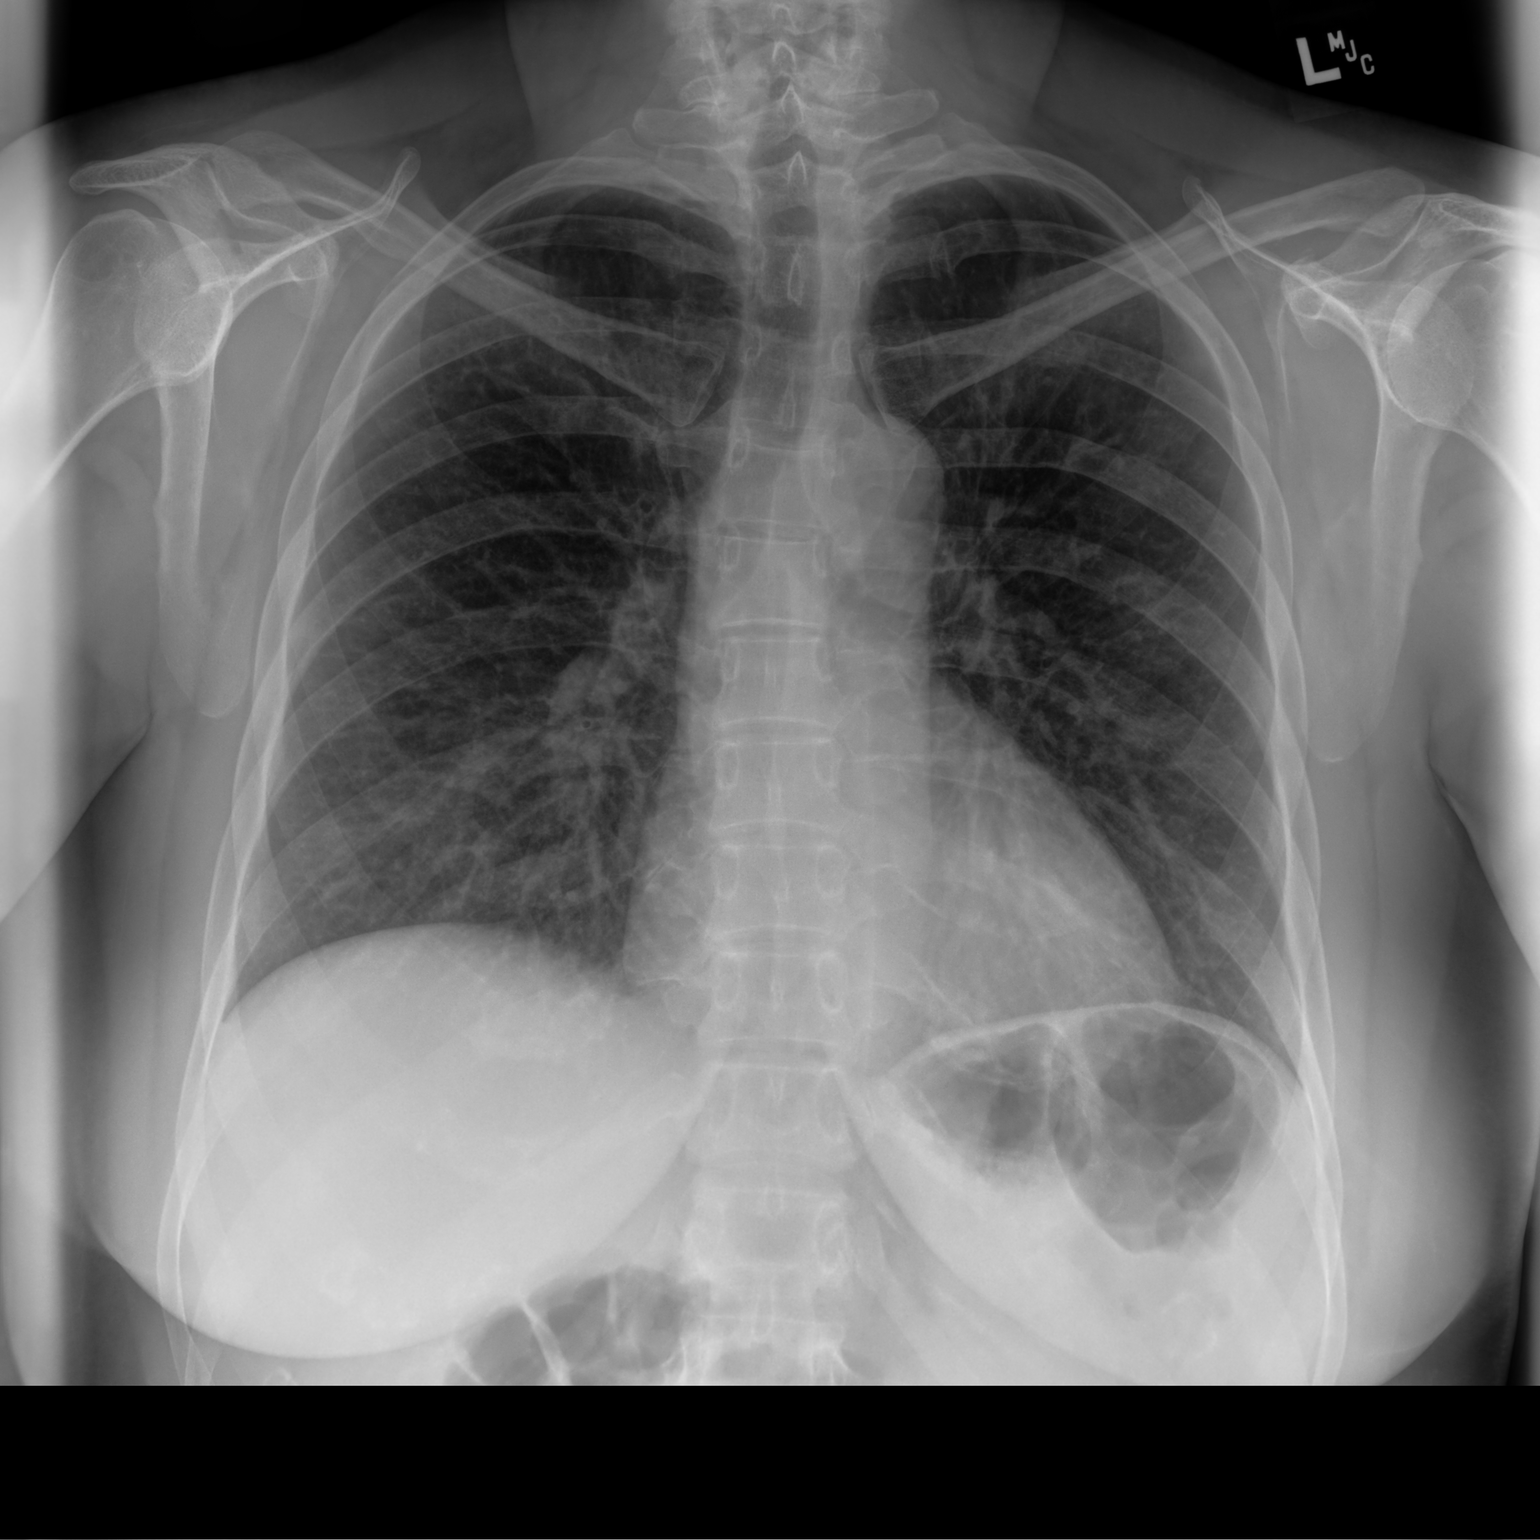

[chest lat]
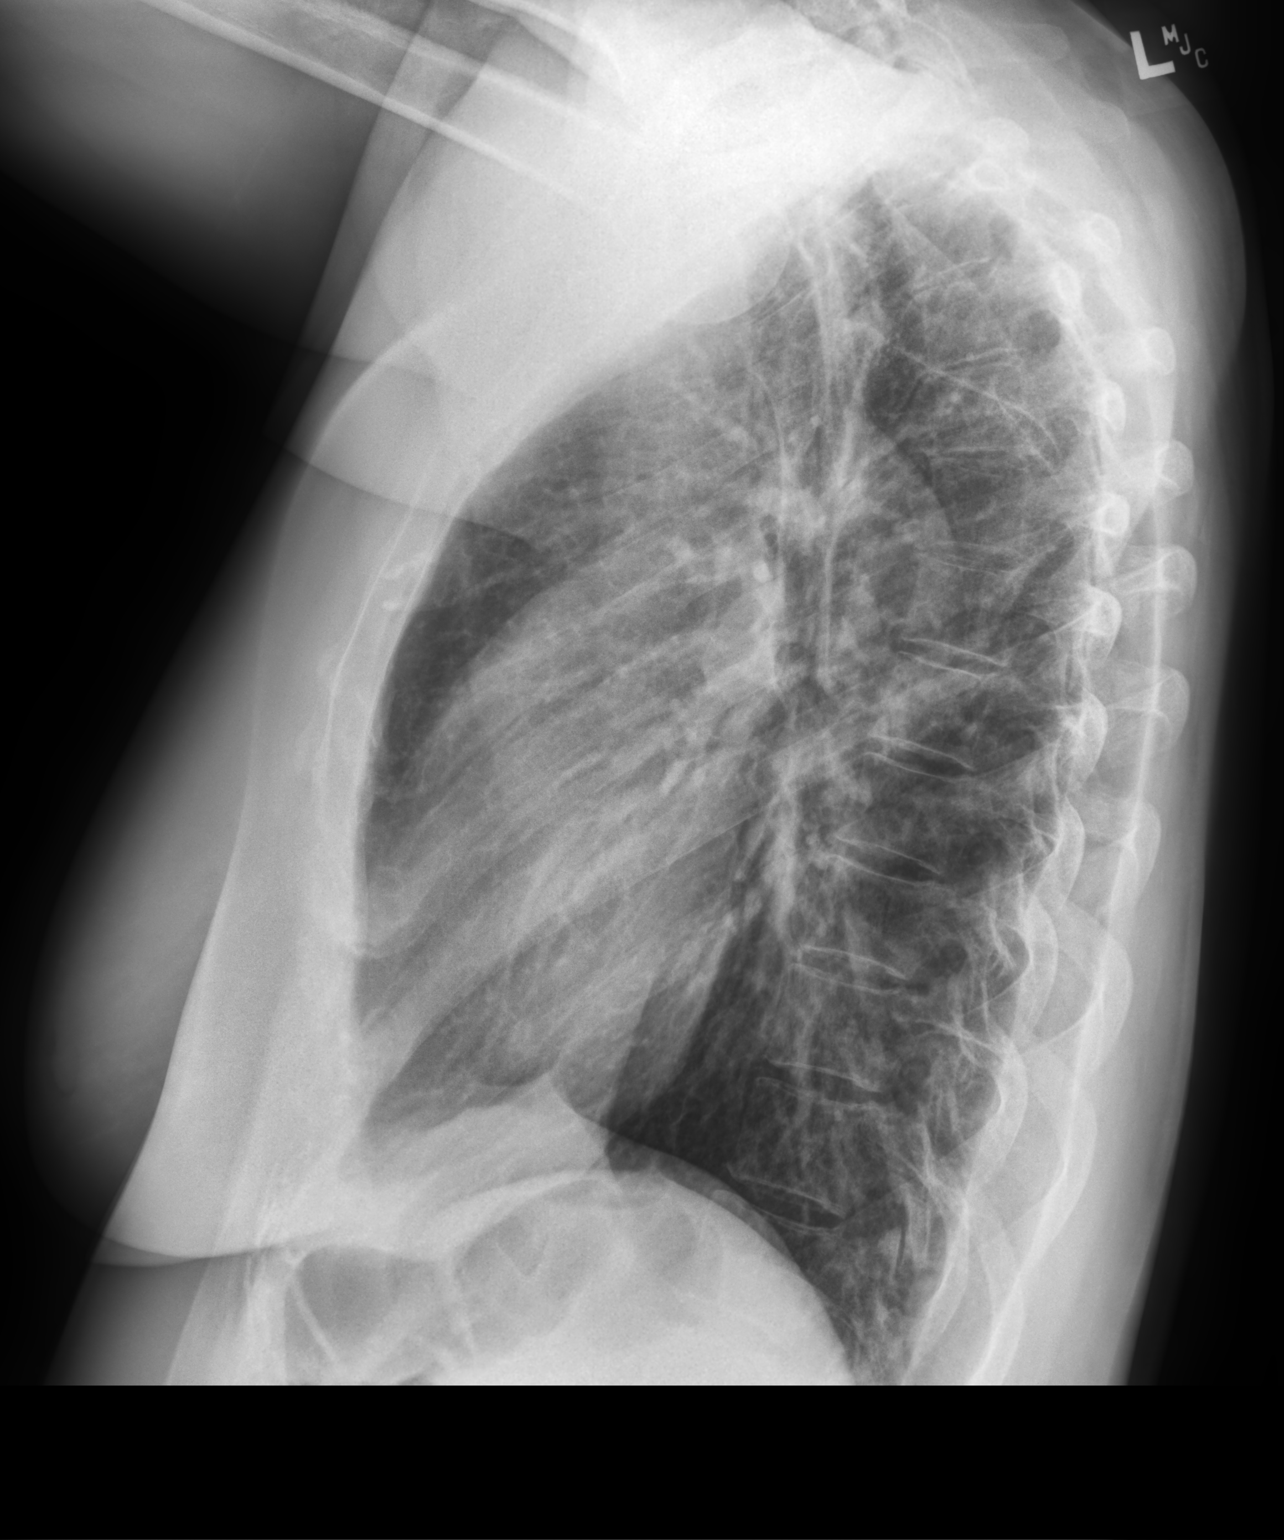

[2 of 2 positions shown; findings below may reference images not displayed]

FINDINGS: Normal heart size and mediastinal contours. No acute infiltrate or
edema. No effusion or pneumothorax. No acute osseous findings.
IMPRESSION: No visible pneumonia.

## 2020-10-26 ENCOUNTER — Emergency Department (HOSPITAL_COMMUNITY): Payer: Worker's Compensation

## 2020-10-26 ENCOUNTER — Encounter (HOSPITAL_COMMUNITY): Payer: Self-pay

## 2020-10-26 ENCOUNTER — Other Ambulatory Visit: Payer: Self-pay

## 2020-10-26 ENCOUNTER — Emergency Department (HOSPITAL_COMMUNITY)
Admission: EM | Admit: 2020-10-26 | Discharge: 2020-10-27 | Disposition: A | Discharge status: Home / Self Care | Payer: Worker's Compensation | Attending: Student | Admitting: Student

## 2020-10-26 DIAGNOSIS — Z5321 Procedure and treatment not carried out due to patient leaving prior to being seen by health care provider: Secondary | ICD-10-CM | POA: Diagnosis not present

## 2020-10-26 DIAGNOSIS — M542 Cervicalgia: Secondary | ICD-10-CM | POA: Diagnosis not present

## 2020-10-26 DIAGNOSIS — R519 Headache, unspecified: Secondary | ICD-10-CM | POA: Diagnosis not present

## 2020-10-26 DIAGNOSIS — R109 Unspecified abdominal pain: Secondary | ICD-10-CM | POA: Diagnosis present

## 2020-10-26 DIAGNOSIS — M79642 Pain in left hand: Secondary | ICD-10-CM | POA: Diagnosis not present
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
## 2020-10-26 NOTE — ED Notes (Signed)
Pt stated she was going to urgent care in the morning, left AMA
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
## 2022-09-18 ENCOUNTER — Telehealth: Payer: Medicaid Other | Admitting: Physician Assistant

## 2022-09-18 DIAGNOSIS — L255 Unspecified contact dermatitis due to plants, except food: Secondary | ICD-10-CM

## 2022-09-18 MED ORDER — PREDNISONE 10 MG PO TABS: ORAL_TABLET | ORAL | 0 refills | Status: DC

## 2022-09-18 NOTE — Progress Notes (Signed)
E-Visit for Poison Ivy  We are sorry that you are not feeing well.  Here is how we plan to help!  Based on what you have shared with me it looks like you have had an allergic reaction to the oily resin from a group of plants.  This resin is very sticky, so it easily attaches to your skin, clothing, tools equipment, and pet's fur.    This blistering rash is often called poison ivy rash although it can come from contact with the leaves, stems and roots of poison ivy, poison oak and poison sumac.  The oily resin contains urushiol (u-ROO-she-ol) that produces a skin rash on exposed skin.  The severity of the rash depends on the amount of urushiol that gets on your skin.  A section of skin with more urushiol on it may develop a rash sooner.  The rash usually develops 12-48 hours after exposure and can last two to three weeks.  Your skin must come in direct contact with the plant's oil to be affected.  Blister fluid doesn't spread the rash.  However, if you come into contact with a piece of clothing or pet fur that has urushiol on it, the rash may spread out.  You can also transfer the oil to other parts of your body with your fingers.  Often the rash looks like a straight line because of the way the plant brushes against your skin.  Since your rash is widespread or has resulted in a large number of blisters, I have prescribed an oral corticosteroid.  Please follow these recommendations:  I have sent a prednisone dose pack to your chosen pharmacy. Be sure to follow the instructions carefully and complete the entire prescription. You may use Benadryl or Caladryl topical lotions to sooth the itch and remember cool, not hot, showers and baths can help relieve the itching!  Place cool, wet compresses on the affected area for 15-30 minutes several times a day.  You may also take oral antihistamines, such as diphenhydramine (Benadryl, others), which may also help you sleep better.  Watch your skin for any purulent  (pus) drainage or red streaking from the site.  If this occurs, contact your provider.  You may require an antibiotic for a skin infection.  Make sure that the clothes you were wearing as well as any towels or sheets that may have come in contact with the oil (urushiol) are washed in detergent and hot water.       I have developed the following plan to treat your condition I am prescribing a two week course of steroids (37 tablets of 10 mg prednisone).  Days 1-4 take 4 tablets (40 mg) daily  Days 5-8 take 3 tablets (30 mg) daily, Days 9-11 take 2 tablets (20 mg) daily, Days 12-14 take 1 tablet (10 mg) daily.    What can you do to prevent this rash?  Avoid the plants.  Learn how to identify poison ivy, poison oak and poison sumac in all seasons.  When hiking or engaging in other activities that might expose you to these plants, try to stay on cleared pathways.  If camping, make sure you pitch your tent in an area free of these plants.  Keep pets from running through wooded areas so that urushiol doesn't accidentally stick to their fur, which you may touch.  Remove or kill the plants.  In your yard, you can get rid of poison ivy by applying an herbicide or pulling it out of   the ground, including the roots, while wearing heavy gloves.  Afterward remove the gloves and thoroughly wash them and your hands.  Don't burn poison ivy or related plants because the urushiol can be carried by smoke.  Wear protective clothing.  If needed, protect your skin by wearing socks, boots, pants, long sleeves and vinyl gloves.  Wash your skin right away.  Washing off the oil with soap and water within 30 minutes of exposure may reduce your chances of getting a poison ivy rash.  Even washing after an hour or so can help reduce the severity of the rash.  If you walk through some poison ivy and then later touch your shoes, you may get some urushiol on your hands, which may then transfer to your face or body by touching or  rubbing.  If the contaminated object isn't cleaned, the urushiol on it can still cause a skin reaction years later.    Be careful not to reuse towels after you have washed your skin.  Also carefully wash clothing in detergent and hot water to remove all traces of the oil.  Handle contaminated clothing carefully so you don't transfer the urushiol to yourself, furniture, rugs or appliances.  Remember that pets can carry the oil on their fur and paws.  If you think your pet may be contaminated with urushiol, put on some long rubber gloves and give your pet a bath.  Finally, be careful not to burn these plants as the smoke can contain traces of the oil.  Inhaling the smoke may result in difficulty breathing. If that occurred you should see a physician as soon as possible.  See your doctor right away if:  The reaction is severe or widespread You inhaled the smoke from burning poison ivy and are having difficulty breathing Your skin continues to swell The rash affects your eyes, mouth or genitals Blisters are oozing pus You develop a fever greater than 100 F (37.8 C) The rash doesn't get better within a few weeks.  If you scratch the poison ivy rash, bacteria under your fingernails may cause the skin to become infected.  See your doctor if pus starts oozing from the blisters.  Treatment generally includes antibiotics.  Poison ivy treatments are usually limited to self-care methods.  And the rash typically goes away on its own in two to three weeks.     If the rash is widespread or results in a large number of blisters, your doctor may prescribe an oral corticosteroid, such as prednisone.  If a bacterial infection has developed at the rash site, your doctor may give you a prescription for an oral antibiotic.  MAKE SURE YOU  Understand these instructions. Will watch your condition. Will get help right away if you are not doing well or get worse.   Thank you for choosing an e-visit.  Your  e-visit answers were reviewed by a board certified advanced clinical practitioner to complete your personal care plan. Depending upon the condition, your plan could have included both over the counter or prescription medications.  Please review your pharmacy choice. Make sure the pharmacy is open so you can pick up prescription now. If there is a problem, you may contact your provider through MyChart messaging and have the prescription routed to another pharmacy.  Your safety is important to us. If you have drug allergies check your prescription carefully.   For the next 24 hours you can use MyChart to ask questions about today's visit, request a non-urgent   call back, or ask for a work or school excuse. You will get an email in the next two days asking about your experience. I hope that your e-visit has been valuable and will speed your recovery.   I have spent 5 minutes in review of e-visit questionnaire, review and updating patient chart, medical decision making and response to patient.   Jennifer M Burnette, PA-C  

## 2023-05-03 ENCOUNTER — Encounter: Payer: Self-pay | Admitting: Cardiology

## 2023-05-03 NOTE — Progress Notes (Deleted)
 Cardiology Office Note:    Date:  05/03/2023   ID:  Jocelyn Bridges, DOB [birth year], MRN [id number]  PCP:  Patient, No Pcp Per  Cardiologist:  Norman Herrlich, MD   Referring MD: Threasa Alpha, MD  ASSESSMENT:    1. Palpitations   2. Essential hypertension   3. PSVT (paroxysmal supraventricular tachycardia) (HCC)    PLAN:    In order of problems listed above:  ***  Next appointment   Medication Adjustments/Labs and Tests Ordered: Current medicines are reviewed at length with the patient today.  Concerns regarding medicines are outlined above.  No orders of the defined types were placed in this encounter.  No orders of the defined types were placed in this encounter.    No chief complaint on file. ***  History of Present Illness:    Jocelyn Bridges is a 43 y.o. female history of hypertension panic attack who is being seen today for the evaluation of palpitation at the request of Threasa Alpha, MD. document an episode of SVT 2021 which broke with adenosine with EMS transport in the setting of COVID 19 infection.  EKG in the emergency room in 2021 showed sinus rhythm and was normal.   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   busPIRone (BUSPAR) 5 MG tablet Take 5 mg by mouth 3 (three) times daily as needed (anxiety).   lisinopril (ZESTRIL) 5 MG tablet Take 5 mg by mouth 2 (two) times daily.     Allergies:   Metoclopramide hcl   Social History    Socioeconomic History   Marital status: Married    Spouse name: Not on file   Number of children: Not on file   Years of education: Not on file   Highest education level: Not on file  Occupational History   Not on file  Tobacco Use   Smoking status: Never   Smokeless tobacco: Never  Vaping Use   Vaping status: Never Used  Substance and Sexual Activity   Alcohol use: No   Drug use: No   Sexual activity: Yes    Birth control/protection: None    Comment: coil  Other Topics Concern   Not on file  Social History Narrative   Not on file   Social Drivers of Health   Financial Resource Strain: Not on file  Food Insecurity: Not at Risk (04/14/2023)   Received from [employer]    Food: 1  Transportation Needs: Not at Risk (04/14/2023)   Received from [employer] [facility]    Transportation: 1  Physical Activity: Not on file  Stress: Not on file  Social Connections: Not on file     Family History: The patient's ***family history includes Cancer in her paternal grandfather; Diabetes in her father; Hypertension in her father and mother; Seizures in her brother. There is no history of Anesthesia problems, Hypotension, Malignant hyperthermia, or Pseudochol deficiency.  ROS:  ROS Please see the history of present illness.    *** All other systems reviewed and are negative.  EKGs/Labs/Other Studies Reviewed:    The following studies were reviewed today: ***      EKG:  EKG is *** ordered today.  The ekg ordered today is personally reviewed and demonstrates ***  Recent Labs:  04/13/2023 CMP normal GFR 89 cc/min potassium 4.5 sodium 140 hemoglobin 14.2 platelets also normal 308,000 cholesterol 228 LDL 164 triglycerides 67 Physical Exam:    VS:  There were no vitals taken for this visit.    Wt Readings from Last 3 Encounters:  10/26/20 220 lb (99.8 kg)  07/01/19 198 lb (89.8 kg)  02/04/11 230 lb (104.3 kg)     GEN: *** Well nourished, well  developed in no acute distress HEENT: Normal NECK: No JVD; No carotid bruits LYMPHATICS: No lymphadenopathy CARDIAC: ***RRR, no murmurs, rubs, gallops RESPIRATORY:  Clear to auscultation without rales, wheezing or rhonchi  ABDOMEN: Soft, non-tender, non-distended MUSCULOSKELETAL:  No edema; No deformity  SKIN: Warm and dry NEUROLOGIC:  Alert and oriented x 3 PSYCHIATRIC:  Normal affect     Signed, Norman Herrlich, MD  05/03/2023 12:14 PM    [facility]

## 2023-05-04 ENCOUNTER — Ambulatory Visit: Admitting: Cardiology

## 2023-05-04 ENCOUNTER — Ambulatory Visit

## 2023-05-04 VITALS — BP 146/98 | HR 97 | Ht 68.0 in | Wt 220.6 lb

## 2023-05-04 DIAGNOSIS — I10 Essential (primary) hypertension: Secondary | ICD-10-CM

## 2023-05-04 DIAGNOSIS — R002 Palpitations: Secondary | ICD-10-CM | POA: Diagnosis not present

## 2023-05-04 DIAGNOSIS — I471 Supraventricular tachycardia, unspecified: Secondary | ICD-10-CM

## 2023-05-04 MED ORDER — DILTIAZEM HCL ER COATED BEADS 120 MG PO CP24: 120.0000 mg | ORAL_CAPSULE | Freq: Every day | ORAL | 3 refills | Status: DC

## 2023-05-04 NOTE — Assessment & Plan Note (Signed)
 symptoms with intense episodes lasting multiple minutes. Increasing frequency.  Consistent with remote history of SVT episode and appears to terminate occasionally with vagal maneuvers.  Will obtain Zio patch monitor for 28 days. Okay to mail it back sooner if she feels she is captured enough symptomatic episodes.  Will obtain transthoracic echocardiogram to rule out any structural and functional abnormalities.  Recommended sleep study evaluation through work PCP given her history of snoring.  Starting diltiazem 120 mg once daily for blood pressure as above which should also help with any recurrent SVT episodes. Advised to keep her still well-hydrated. Advised to avoid stimulants such as excess coffee, sodas, energy drinks.

## 2023-05-04 NOTE — Progress Notes (Signed)
 Cardiology Consultation:    Date:  05/04/2023   ID:  Jocelyn Bridges, DOB 10-Sep-1980, MRN [id number]  PCP:  Patient, No Pcp Per  Cardiologist:  Jocelyn Murphy, MD   Referring MD: Threasa Alpha, MD   No chief complaint on file.    ASSESSMENT AND PLAN:   [name] 43 year old woman with history of hypertension, remote history of SVT episode in 2021 now with increasing frequency of palpitations here for further evaluation. Problem List Items Addressed This Visit     Palpitations - Primary    symptoms with intense episodes lasting multiple minutes. Increasing frequency.  Consistent with remote history of SVT episode and appears to terminate occasionally with vagal maneuvers.  Will obtain Zio patch monitor for 28 days. Okay to mail it back sooner if she feels she is captured enough symptomatic episodes.  Will obtain transthoracic echocardiogram to rule out any structural and functional abnormalities.  Recommended sleep study evaluation through work PCP given her history of snoring.  Starting diltiazem 120 mg once daily for blood pressure as above which should also help with any recurrent SVT episodes. Advised to keep her still well-hydrated. Advised to avoid stimulants such as excess coffee, sodas, energy drinks.         Relevant Orders   EKG 12-Lead (Completed)   LONG TERM MONITOR (3-14 DAYS)   ECHOCARDIOGRAM COMPLETE   Hypertension   Blood pressure uncontrolled. PCP recently started lisinopril 5 mg which was titrated up to twice daily dose.  She is taking this consistently.  Blood pressures uncontrolled today. Target below 130 over 80 mmHg. Continue lisinopril but recommended to take this 10 mg once a day. Will add diltiazem 120 mg once daily as below for palpitations.  Will obtain transthoracic echocardiogram to assess for any significant longstanding changes related to hypertension and to assess cardiac structure and function in the setting of  palpitations.      Return to the based on test results.   History of Present Illness:    Jocelyn Bridges is a 43 y.o. female who is being seen today for the evaluation of palpitations at the request of Threasa Alpha, MD.  She only recently established care with PCP after going without regular PCP visits for the past 3 years.  Has a history of history of hypertension, panic attack.  Has documented  episode of SVT 2021 which broke with adenosine with EMS transport in the setting of COVID 19 infection.  EKG in the emergency room in 2021 showed sinus rhythm and was normal.  Pleasant woman lives at home with her family.  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  She has had longstanding history of palpitations on and off. Now she has been noticing this to occur more frequently occurring once a week.  Random occurrence associated with a sensation of fast heartbeat that can go on for minutes the longest episode was about 45 minutes during an event occurred 3 weeks ago.  Appears spontaneously and resolves spontaneously.  Can feel lightheaded and dizzy during these episodes.  Did not have a syncopal episode of fall.  She tends to sit down or lay down when pulled her feet up and attempts to bear down.  She has had instances where this broke the episode.  She does feel her heart thump towards the end of an episode after which she returns to normal.  No specific trigger that she can identify. No regular  exercise but she has recently started increasing her activity with the new change in job. Her children have told her she snores.  She has not had sleep apnea test done before Does not smoke. Social alcohol admission. Drinks coffee 1 to 2 cups a day and occasional soda consumption.  EKG in the clinic today shows sinus rhythm heart rate 97/min, normal PR interval 132 ms, QRS duration 84 ms, QTc 464 ms.  No significant ST-T changes.  Blood  work fromRecent blood work from 2-30-2025 showed normal thyroid panel with TSH 1.46, free T4 was normal 1.13 Lipid panel with total cholesterol 228, HDL 52, LDL 164, triglycerides 67 Normal electrolyte panel sodium 140, potassium 4.5, BUN 10, creatinine 0.83, EGFR 89 Normal transaminases and alkaline phosphatase Blood counts normal with hemoglobin 14.2, hematocrit 43.3 Urine test mildly abnormal for blood noted in urine and she is following up in this regard with her PCP tomorrow.  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   busPIRone (BUSPAR) 5 MG tablet Take 5 mg by mouth 3 (three) times daily as needed (anxiety).   lisinopril (ZESTRIL) 5 MG tablet Take 5 mg by mouth 2 (two) times daily.   ondansetron (ZOFRAN ODT) 4 MG disintegrating tablet Take 1 tablet (4 mg total) by mouth every 8 (eight) hours as needed for nausea or vomiting.     Allergies:   Metoclopramide hcl   Social History   Socioeconomic History   Marital status: Married    Spouse name: Not on file   Number of children: Not on file   Years of education: Not on file   Highest education level: Not on file  Occupational History   Not on file  Tobacco Use   Smoking status: Never   Smokeless tobacco: Never  Vaping Use   Vaping status: Never Used  Substance and Sexual Activity   Alcohol use: No   Drug use: No   Sexual activity: Yes    Birth control/protection: None    Comment: coil  Other  Topics Concern   Not on file  Social History Narrative   Not on file   Social Drivers of Health   Financial Resource Strain: Not on file  Food Insecurity: Not at Risk (04/14/2023)   Received from [employer]    Food: 1  Transportation Needs: Not at Risk (04/14/2023)   Received from [employer] [facility]    Transportation: 1  Physical Activity: Not on file  Stress: Not on file  Social Connections: Not on file     Family History: The patient's family history includes Cancer in her father and paternal grandfather; Diabetes in her father; Heart Problems in her paternal grandmother; Hypertension in her father and mother; Seizures in her brother. There is no history of Anesthesia problems, Hypotension, Malignant hyperthermia, or Pseudochol deficiency. ROS:   Please see the history of present illness.    All 14 point review  of systems negative except as described per history of present illness.  EKGs/Labs/Other Studies Reviewed:    The following studies were reviewed today:   EKG:  EKG Interpretation Date/Time:  Thursday May 04 2023 10:52:11 EST Ventricular Rate:  97 PR Interval:  132 QRS Duration:  84 QT Interval:  366 QTC Calculation: 464 R Axis:   11  Text Interpretation: Normal sinus rhythm Normal ECG When compared with ECG of 01-Jul-2019 03:07, PREVIOUS ECG IS PRESENT Confirmed by Huntley Dec reddy [id number]) on 05/04/2023 11:32:16 AM    Recent Labs: No results found for requested labs within last 365 days.  Recent Lipid Panel No results found for: "CHOL", "TRIG", "HDL", "CHOLHDL", "VLDL", "LDLCALC", "LDLDIRECT"  Physical Exam:    VS:  BP (!) 146/98   Pulse 97   Ht 5\' 8"  (1.727 m)   Wt 220 lb 9.6 oz (100.1 kg)   SpO2 98%   BMI 33.54 kg/m     Wt Readings from Last 3 Encounters:  05/04/23 220 lb 9.6 oz (100.1 kg)  10/26/20 220 lb (99.8 kg)  07/01/19 198 lb (89.8 kg)     GENERAL:  Well nourished, well developed in no acute  distress NECK: No JVD; No carotid bruits CARDIAC: RRR, S1 and S2 present, no murmurs, no rubs, no gallops CHEST:  Clear to auscultation without rales, wheezing or rhonchi  Extremities: No pitting pedal edema. Pulses bilaterally symmetric with radial 2+ and dorsalis pedis 2+ NEUROLOGIC:  Alert and oriented x 3  Medication Adjustments/Labs and Tests Ordered: Current medicines are reviewed at length with the patient today.  Concerns regarding medicines are outlined above.  Orders Placed This Encounter  Procedures   LONG TERM MONITOR (3-14 DAYS)   EKG 12-Lead   ECHOCARDIOGRAM COMPLETE   No orders of the defined types were placed in this encounter.   Signed, Cecille Amsterdam, MD, MPH, [facility]. 05/04/2023 11:51 AM    [facility]

## 2023-05-04 NOTE — Patient Instructions (Addendum)
 Medication Instructions:    START: Diltiazem 120mg  daily  INCREASE: Lisinopril to 10mg  daily   Lab Work: None Ordered If you have labs (blood work) drawn today and your tests are completely normal, you will receive your results only by: MyChart Message (if you have MyChart) OR A paper copy in the mail If you have any lab test that is abnormal or we need to change your treatment, we will call you to review the results.   Testing/Procedures:   WHY IS MY DOCTOR PRESCRIBING ZIO? The Zio system is proven and trusted by physicians to detect and diagnose irregular heart rhythms -- and has been prescribed to hundreds of thousands of patients.  The FDA has cleared the Zio system to monitor for many different kinds of irregular heart rhythms. In a study, physicians were able to reach a diagnosis 90% of the time with the Zio system1.  You can wear the Zio monitor -- a small, discreet, comfortable patch -- during your normal day-to-day activity, including while you sleep, shower, and exercise, while it records every single heartbeat for analysis.  1Barrett, P., et al. Comparison of 24 Hour Holter Monitoring Versus 14 Day Novel Adhesive Patch Electrocardiographic Monitoring. American Journal of Medicine, 2014.  ZIO VS. HOLTER MONITORING The Zio monitor can be comfortably worn for up to 14 days. Holter monitors can be worn for 24 to 48 hours, limiting the time to record any irregular heart rhythms you may have. Zio is able to capture data for the 51% of patients who have their first symptom-triggered arrhythmia after 48 hours.1  LIVE WITHOUT RESTRICTIONS The Zio ambulatory cardiac monitor is a small, unobtrusive, and water-resistant patch--you might even forget you're wearing it. The Zio monitor records and stores every beat of your heart, whether you're sleeping, working out, or showering.      Your physician has requested that you have an echocardiogram. Echocardiography is a painless test  that uses sound waves to create images of your heart. It provides your doctor with information about the size and shape of your heart and how well your heart's chambers and valves are working. This procedure takes approximately one hour. There are no restrictions for this procedure. Please do NOT wear cologne, perfume, aftershave, or lotions (deodorant is allowed). Please arrive 15 minutes prior to your appointment time.  Please note: We ask at that you not bring children with you during ultrasound (echo/ vascular) testing. Due to room size and safety concerns, children are not allowed in the ultrasound rooms during exams. Our front office staff cannot provide observation of children in our lobby area while testing is being conducted. An adult accompanying a patient to their appointment will only be allowed in the ultrasound room at the discretion of the ultrasound technician under special circumstances. We apologize for any inconvenience.    Follow-Up: At [facility], you and your health needs are our priority.  As part of our continuing mission to provide you with exceptional heart care, we have created designated Provider Care Teams.  These Care Teams include your primary Cardiologist (physician) and Advanced Practice Providers (APPs -  Physician Assistants and Nurse Practitioners) who all work together to provide you with the care you need, when you need it.  We recommend signing up for the patient portal called "MyChart".  Sign up information is provided on this After Visit Summary.  MyChart is used to connect with patients for Virtual Visits (Telemedicine).  Patients are able to view lab/test results, encounter notes, upcoming  appointments, etc.  Non-urgent messages can be sent to your provider as well.   To learn more about what you can do with MyChart, go to [email or web address].    Your next appointment:   Based on test results  The format for your next appointment:   In  Person  Provider:   Huntley Dec, MD   Other Instructions NA

## 2023-05-04 NOTE — Assessment & Plan Note (Signed)
 Blood pressure uncontrolled. PCP recently started lisinopril 5 mg which was titrated up to twice daily dose.  She is taking this consistently.  Blood pressures uncontrolled today. Target below 130 over 80 mmHg. Continue lisinopril but recommended to take this 10 mg once a day. Will add diltiazem 120 mg once daily as below for palpitations.  Will obtain transthoracic echocardiogram to assess for any significant longstanding changes related to hypertension and to assess cardiac structure and function in the setting of palpitations.

## 2023-05-05 DIAGNOSIS — F339 Major depressive disorder, recurrent, unspecified: Secondary | ICD-10-CM | POA: Insufficient documentation

## 2023-05-06 DIAGNOSIS — N926 Irregular menstruation, unspecified: Secondary | ICD-10-CM | POA: Insufficient documentation

## 2023-05-13 ENCOUNTER — Ambulatory Visit: Admission: EM | Admit: 2023-05-13 | Discharge: 2023-05-13 | Disposition: A | Discharge status: Home / Self Care

## 2023-05-13 DIAGNOSIS — S61206A Unspecified open wound of right little finger without damage to nail, initial encounter: Secondary | ICD-10-CM | POA: Diagnosis not present

## 2023-05-13 MED ORDER — MUPIROCIN 2 % EX OINT: 1.0000 | TOPICAL_OINTMENT | Freq: Two times a day (BID) | CUTANEOUS | 0 refills | Status: DC

## 2023-05-13 MED ORDER — TETANUS-DIPHTH-ACELL PERTUSSIS 5-2.5-18.5 LF-MCG/0.5 IM SUSY
0.5000 mL | PREFILLED_SYRINGE | Freq: Once | INTRAMUSCULAR | Status: AC
  Administered 2023-05-13: 0.5 mL via INTRAMUSCULAR

## 2023-05-13 NOTE — ED Triage Notes (Signed)
"  I was chopping an onion with sliding slicer and my finger was too close to blade and cut my (right hand, tip of 5th digit)". Last Tdap "I am past due".

## 2023-05-13 NOTE — ED Provider Notes (Signed)
 [facility]    CSN: [id number] Arrival date & time: 05/13/23  1540      History   Chief Complaint Chief Complaint  Patient presents with   Laceration    HPI Jocelyn Bridges is a 43 y.o. female.   43 year old female who presents urgent care with complaints of a laceration to the right fifth finger.  This happened when she was cutting onions with a sliding slicer and got her finger to close.  She immediately washed the area and put a dressing in place.  She did get a little lightheaded after this.  She reports that she is out of date on her tetanus.  She denies any other injuries or symptoms.   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   Normal pregnancy 02/04/2011   IMO SNOMED Dx Update Oct 2024     SVD (spontaneous vaginal delivery) 02/04/2011    Patient Active Problem List   Diagnosis Date Noted   Irregular periods 05/06/2023   Episode of recurrent major depressive disorder (HCC) 05/05/2023   Palpitations 05/04/2023   Hypertension 05/04/2023   Major depressive disorder, 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 Authorizing Provider  buPROPion (WELLBUTRIN XL) 150 MG 24 hr tablet Take 1 tablet  by mouth every morning. 04/13/23  Yes [provider]  busPIRone (BUSPAR) 5 MG tablet Take 5 mg by mouth 3 (three) times daily as needed (anxiety). 04/13/23  Yes [provider]  diltiazem (CARDIZEM CD) 120 MG 24 hr capsule Take 1 tablet by mouth daily.   Yes [provider]  lisinopril (ZESTRIL) 5 MG tablet Take 5 mg by mouth 2 (two) times daily. 04/24/23  Yes [provider]  mupirocin ointment (BACTROBAN) 2 % Apply 1 Application topically 2 (two) times daily. 05/13/23  Yes Shermar Friedland A, PA-C  diltiazem (CARDIZEM CD) 120 MG 24 hr capsule Take 1 capsule (120 mg total) by mouth daily. 05/04/23 08/02/23  Madireddy, Marlyn Corporal, MD  ondansetron (ZOFRAN ODT) 4 MG disintegrating tablet Take 1 tablet (4 mg total) by mouth every 8 (eight) hours as needed for nausea or vomiting. 06/27/19   Belinda Fisher, PA-C    Family History Family History  Problem Relation Age of Onset   Hypertension Mother    Diabetes Father    Hypertension Father    Cancer Father    Seizures Brother        both brothers   Heart  Problems Paternal Grandmother    Cancer Paternal Grandfather        colon   Anesthesia problems Neg Hx    Hypotension Neg Hx    Malignant hyperthermia Neg Hx    Pseudochol deficiency Neg Hx     Social History Social History   Tobacco Use   Smoking status: Never   Smokeless tobacco: Never  Vaping Use   Vaping status: Never Used  Substance Use Topics   Alcohol use: No   Drug use: No     Allergies   Metoclopramide hcl   Review of Systems Review of Systems  Constitutional:  Negative for chills and fever.  HENT:  Negative for ear pain and sore throat.   Eyes:  Negative for pain and visual disturbance.  Respiratory:  Negative for cough and shortness of breath.   Cardiovascular:  Negative for chest pain and palpitations.  Gastrointestinal:  Negative for abdominal pain and vomiting.  Genitourinary:  Negative for dysuria and hematuria.  Musculoskeletal:   Negative for arthralgias and back pain.  Skin:  Positive for wound (Right fifth finger distally). Negative for color change and rash.  Neurological:  Negative for seizures and syncope.  All other systems reviewed and are negative.    Physical Exam Triage Vital Signs ED Triage Vitals  Encounter Vitals Group     BP 05/13/23 1548 111/77     Systolic BP Percentile --      Diastolic BP Percentile --      Pulse Rate 05/13/23 1548 99     Resp 05/13/23 1548 20     Temp 05/13/23 1548 (!) 97.5 F (36.4 C)     Temp Source 05/13/23 1548 Oral     SpO2 05/13/23 1548 97 %     Weight 05/13/23 1545 220 lb 10.9 oz (100.1 kg)     Height 05/13/23 1545 5\' 8"  (1.727 m)     Head Circumference --      Peak Flow --      Pain Score 05/13/23 1543 4     Pain Loc --      Pain Education --      Exclude from Growth Chart --    No data found.  Updated Vital Signs BP 111/77 (BP Location: Left Arm)   Pulse 99   Temp (!) 97.5 F (36.4 C) (Oral)   Resp 20   Ht 5\' 8"  (1.727 m)   Wt 220 lb 10.9 oz (100.1 kg)   LMP 02/04/2023 (Exact Date)   SpO2 97%   BMI 33.55 kg/m   Visual Acuity Right Eye Distance:   Left Eye Distance:   Bilateral Distance:    Right Eye Near:   Left Eye Near:    Bilateral Near:     Physical Exam Vitals and nursing note reviewed.  Constitutional:      General: She is not in acute distress.    Appearance: She is well-developed.  HENT:     Head: Normocephalic and atraumatic.  Eyes:     Conjunctiva/sclera: Conjunctivae normal.  Cardiovascular:     Rate and Rhythm: Normal rate and regular rhythm.     Heart sounds: No murmur heard. Pulmonary:     Effort: Pulmonary effort is normal. No respiratory distress.     Breath sounds: Normal breath sounds.  Abdominal:     Palpations: Abdomen is soft.     Tenderness: There is no abdominal tenderness.  Musculoskeletal:        General: No swelling.  Cervical back: Neck supple.  Skin:    General: Skin is warm and dry.      Capillary Refill: Capillary refill takes less than 2 seconds.     Comments: Right fifth finger with small avulsion of the skin off the tip of the finger, no laceration to suture.  Neurological:     Mental Status: She is alert.  Psychiatric:        Mood and Affect: Mood normal.      UC Treatments / Results  Labs (all labs ordered are listed, but only abnormal results are displayed) Labs Reviewed - No data to display  EKG   Radiology No results found.  Procedures Procedures (including critical care time)  Medications Ordered in UC Medications  Tdap (BOOSTRIX) injection 0.5 mL (0.5 mLs Intramuscular Given 05/13/23 1613)    Initial Impression / Assessment and Plan / UC Course  I have reviewed the triage vital signs and the nursing notes.  Pertinent labs & imaging results that were available during my care of the patient were reviewed by me and considered in my medical decision making (see chart for details).     Open wound of right little finger   Small skin avulsion of the right fifth finger.  This does not require any suturing.  We will place antibacterial ointment on the area and a pressure dressing.  Leave that dressing in place until tomorrow then may remove and wash the area with soap and water.  Apply mupirocin twice daily to the area until the area is completely healed and keep a clean dressing over the area.  May use a finger condom if the area is going to get wet during the day.  Tdap is updated today.  Return to urgent care as needed.  Final Clinical Impressions(s) / UC Diagnoses   Final diagnoses:  Open wound of right little finger     Discharge Instructions      Small avulsion of the right fifth finger.  This does not require any suturing.  We will place antibacterial ointment on the area and a pressure dressing.  Leave that dressing in place until tomorrow then may remove and wash the area with soap and water.  Apply mupirocin twice daily to the area until  the area is completely healed and keep a clean dressing over the area.  May use a finger condom if the area is going to get wet during the day.  Tdap is updated today.  Return to urgent care as needed.   ED Prescriptions     Medication Sig Dispense Auth. Provider   mupirocin ointment (BACTROBAN) 2 % Apply 1 Application topically 2 (two) times daily. 22 g Landis Martins, [place]      PDMP not reviewed this encounter.   Landis Martins, [place] 05/13/23 1622

## 2023-05-13 NOTE — Discharge Instructions (Addendum)
 Small skin avulsion of the right fifth finger.  This does not require any suturing.  We will place antibacterial ointment on the area and a pressure dressing.  Leave that dressing in place until tomorrow then may remove and wash the area with soap and water.  Apply mupirocin twice daily to the area until the area is completely healed and keep a clean dressing over the area.  May use a finger condom if the area is going to get wet during the day.  Tdap is updated today.  Return to urgent care as needed.

## 2023-05-15 ENCOUNTER — Telehealth: Payer: Self-pay

## 2023-05-15 NOTE — Telephone Encounter (Signed)
 Received referral from [facility] -  Dr Threasa Alpha - for snoring and daytime somnolence. Placed in sleep mailbox

## 2023-05-24 ENCOUNTER — Ambulatory Visit

## 2023-05-24 DIAGNOSIS — R002 Palpitations: Secondary | ICD-10-CM | POA: Diagnosis not present

## 2023-05-25 LAB — ECHOCARDIOGRAM COMPLETE
Area-P 1/2: 3.37 cm2
S' Lateral: 2.85 cm

## 2023-08-26 ENCOUNTER — Telehealth: Admitting: Family Medicine

## 2023-08-26 DIAGNOSIS — L255 Unspecified contact dermatitis due to plants, except food: Secondary | ICD-10-CM | POA: Diagnosis not present

## 2023-08-26 MED ORDER — PREDNISONE 10 MG (21) PO TBPK: ORAL_TABLET | ORAL | 0 refills | Status: DC

## 2023-08-26 NOTE — Progress Notes (Signed)
 E-Visit for [employer]  We are sorry that you are not feeing well.  Here is how we plan to help!  Based on what you have shared with me it looks like you have had an allergic reaction to the oily resin from a group of plants.  This resin is very sticky, so it easily attaches to your skin, clothing, tools equipment, and pet's fur.    This blistering rash is often called poison ivy rash although it can come from contact with the leaves, stems and roots of poison ivy, poison oak and poison sumac.  The oily resin contains urushiol (u-ROO-she-ol) that produces a skin rash on exposed skin.  The severity of the rash depends on the amount of urushiol that gets on your skin.  A section of skin with more urushiol on it may develop a rash sooner.  The rash usually develops 12-48 hours after exposure and can last two to three weeks.  Your skin must come in direct contact with the plant's oil to be affected.  Blister fluid doesn't spread the rash.  However, if you come into contact with a piece of clothing or pet fur that has urushiol on it, the rash may spread out.  You can also transfer the oil to other parts of your body with your fingers.  Often the rash looks like a straight line because of the way the plant brushes against your skin.  Since your rash is widespread or has resulted in a large number of blisters, I have prescribed an oral corticosteroid.  Please follow these recommendations:  I have sent a prednisone  dose pack to your chosen pharmacy. Be sure to follow the instructions carefully and complete the entire prescription. You may use Benadryl  or Caladryl topical lotions to sooth the itch and remember cool, not hot, showers and baths can help relieve the itching!  Place cool, wet compresses on the affected area for 15-30 minutes several times a day.  You may also take oral antihistamines, such as diphenhydramine  (Benadryl , others), which may also help you sleep better.  Watch your skin for any purulent  (pus) drainage or red streaking from the site.  If this occurs, contact your provider.  You may require an antibiotic for a skin infection.  Make sure that the clothes you were wearing as well as any towels or sheets that may have come in contact with the oil (urushiol) are washed in detergent and hot water.       I have developed the following plan to treat your condition: I am prescribing oral Prednisone . Please follow the instructions as noted by the pharmacy.  What can you do to prevent this rash?  Avoid the plants.  Learn how to identify poison ivy, poison oak and poison sumac in all seasons.  When hiking or engaging in other activities that might expose you to these plants, try to stay on cleared pathways.  If camping, make sure you pitch your tent in an area free of these plants.  Keep pets from running through wooded areas so that urushiol doesn't accidentally stick to their fur, which you may touch.  Remove or kill the plants.  In your yard, you can get rid of poison ivy by applying an herbicide or pulling it out of the ground, including the roots, while wearing heavy gloves.  Afterward remove the gloves and thoroughly wash them and your hands.  Don't burn poison ivy or related plants because the urushiol can be carried by smoke.  Wear protective clothing.  If needed, protect your skin by wearing socks, boots, pants, long sleeves and vinyl gloves.  Wash your skin right away.  Washing off the oil with soap and water within 30 minutes of exposure may reduce your chances of getting a poison ivy rash.  Even washing after an hour or so can help reduce the severity of the rash.  If you walk through some poison ivy and then later touch your shoes, you may get some urushiol on your hands, which may then transfer to your face or body by touching or rubbing.  If the contaminated object isn't cleaned, the urushiol on it can still cause a skin reaction years later.    Be careful not to reuse towels  after you have washed your skin.  Also carefully wash clothing in detergent and hot water to remove all traces of the oil.  Handle contaminated clothing carefully so you don't transfer the urushiol to yourself, furniture, rugs or appliances.  Remember that pets can carry the oil on their fur and paws.  If you think your pet may be contaminated with urushiol, put on some long rubber gloves and give your pet a bath.  Finally, be careful not to burn these plants as the smoke can contain traces of the oil.  Inhaling the smoke may result in difficulty breathing. If that occurred you should see a physician as soon as possible.  See your doctor right away if:  The reaction is severe or widespread You inhaled the smoke from burning poison ivy and are having difficulty breathing Your skin continues to swell The rash affects your eyes, mouth or genitals Blisters are oozing pus You develop a fever greater than 100 F (37.8 C) The rash doesn't get better within a few weeks.  If you scratch the poison ivy rash, bacteria under your fingernails may cause the skin to become infected.  See your doctor if pus starts oozing from the blisters.  Treatment generally includes antibiotics.  Poison ivy treatments are usually limited to self-care methods.  And the rash typically goes away on its own in two to three weeks.     If the rash is widespread or results in a large number of blisters, your doctor may prescribe an oral corticosteroid, such as prednisone .  If a bacterial infection has developed at the rash site, your doctor may give you a prescription for an oral antibiotic.  MAKE SURE YOU  Understand these instructions. Will watch your condition. Will get help right away if you are not doing well or get worse.   Thank you for choosing an e-visit.  Your e-visit answers were reviewed by a board certified advanced clinical practitioner to complete your personal care plan. Depending upon the condition, your  plan could have included both over the counter or prescription medications.  Please review your pharmacy choice. Make sure the pharmacy is open so you can pick up prescription now. If there is a problem, you may contact your provider through [employer] and have the prescription routed to another pharmacy.  Your safety is important to us . If you have drug allergies check your prescription carefully.   For the next 24 hours you can use MyChart to ask questions about today's visit, request a non-urgent call back, or ask for a work or school excuse. You will get an email in the next two days asking about your experience. I hope that your e-visit has been valuable and will speed your recovery.  I have spent 5 minutes in review of e-visit questionnaire, review and updating patient chart, medical decision making and response to patient.   Roosvelt Mater, PA-C

## 2023-10-11 ENCOUNTER — Encounter (HOSPITAL_COMMUNITY): Payer: Self-pay | Admitting: Obstetrics and Gynecology

## 2023-10-12 ENCOUNTER — Encounter (HOSPITAL_COMMUNITY): Payer: Self-pay | Admitting: Obstetrics and Gynecology

## 2023-10-12 NOTE — Progress Notes (Signed)
 Spoke w/ via phone for pre-op interview--- pt Lab needs dos----    cbc/ cmp/ upt     Lab results------  current EKG in epic/ chart dated 05-04-2023 COVID test -----patient states asymptomatic no test needed Arrive at -------  0830 on 10-16-2023 NPO after MN NO Solid Food.  Clear liquids from MN until--- 0730 Pre-Surgery Ensure or G2: n/a  Med rec completed Medications to take morning of surgery ----- wellbutrin , if needed may take buspar Diabetic medication ----- n/a  GLP1 agonist last dose: n/a GLP1 instructions:  Patient instructed no nail polish to be worn day of surgery Patient instructed to bring photo id and insurance card day of surgery Patient aware to have Driver (ride ) / caregiver    for 24 hours after surgery - son, joshua Mccaster (age 19)  pt stated this only person she has, since she is recently separated and her daughter , age 22 Patient Special Instructions -----  antibacterial soap shower morning of surgery Pre-Op special Instructions -----  pt gave me emergency contact person , updated in epic, to whom surgeon or anesthesia can speak if needed day of surgery .  Her Alyse Janita Deters #[phone] since her is only 59.  Pt verbalized understanding the need since her is not 16.  Patient verbalized understanding of instructions that were given at this phone interview. Patient denies chest pain, sob, fever, cough at the interview.

## 2023-10-15 NOTE — H&P (Signed)
 Jocelyn Bridges is an 43 y.o. female with AUB and pelvic pain, Had Essure tubal ligation in 2013 - desires removal of Essure and B salpingectomy.  D/W pt r/b/a of surgical procedure including, but not limited to bleeding, infection, damage to surrounding organs and trouble healing.    Pertinent Gynecological History: G4P4 SVD x 4 No abn pap, last 05/08/23 No STI  Menstrual History:  Patient's last menstrual period was 09/10/2023 (exact date).    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 liborio--- office note in epic 05-04-2023;  Zio monitor 06-15-2023  SR w/  x5  very brief run SVT, no cardiology final result;  echo 05-24-2023 normal ef 60-65% ;  pt taking cardiazem for palpatation and HTN;   hx remote SVT 2021 in setting COVID after EMS gave adenosis resolved in ED in [facility])    Past Surgical History:  Procedure Laterality Date   ESSURE TUBAL LIGATION Bilateral 2013   in GYN office w/ Sedation by Dr Horacio  w/ Hysteroscopy   NO PAST SURGERIES     WISDOM TOOTH EXTRACTION  2015    Family History  Problem Relation Age of Onset   Hypertension Mother    Diabetes Father    Hypertension Father    Cancer Father    Seizures Brother        both brothers   Heart Problems Paternal Grandmother    Cancer Paternal Grandfather        colon   Anesthesia problems Neg Hx    Hypotension Neg Hx    Malignant hyperthermia Neg Hx    Pseudochol deficiency Neg Hx     Social History:  reports that she has never smoked. She has never used smokeless tobacco. She reports that she does not drink alcohol and does not use drugs. Separated, special needs aide  Allergies: No Known Allergies  Meds: buproprion, buspaar, diltiazam, lisinopril (oxycodone , ibuprofen )  Review of Systems   Constitutional: Negative.   HENT: Negative.    Respiratory: Negative.    Gastrointestinal: Negative.   Genitourinary:  Positive for menstrual problem.  Musculoskeletal: Negative.   Skin: Negative.   Neurological: Negative.   Psychiatric/Behavioral: Negative.      Height 5' 8 (1.727 m), weight 92.1 kg, last menstrual period 09/10/2023. Physical Exam Constitutional:      Appearance: Normal appearance.  HENT:     Head: Normocephalic and atraumatic.  Cardiovascular:     Rate and Rhythm: Normal rate and regular rhythm.  Pulmonary:     Effort: Pulmonary effort is normal.     Breath sounds: Normal breath sounds.  Abdominal:     General: Bowel sounds are normal.     Palpations: Abdomen is soft.  Musculoskeletal:        General: Normal range of motion.     Cervical back: Normal range of motion and neck supple.  Skin:    General: Skin is warm and dry.  Neurological:     General: No focal deficit present.     Mental Status: She is alert and oriented to person, place, and time.  Psychiatric:        Mood and Affect: Mood normal.        Behavior: Behavior normal.      Assessment/Plan: 42yo G4P4 for removal of  Essure device an B salpingectomy D/w pt r/b/a of surgery D/c DOS, has postop meds  Jocelyn Bridges 10/15/2023, 2:44 PM

## 2023-10-16 ENCOUNTER — Ambulatory Visit (HOSPITAL_COMMUNITY): Admitting: Anesthesiology

## 2023-10-16 ENCOUNTER — Ambulatory Visit (HOSPITAL_COMMUNITY)
Admission: RE | Admit: 2023-10-16 | Discharge: 2023-10-16 | Disposition: A | Discharge status: Home / Self Care | Attending: Obstetrics and Gynecology | Admitting: Obstetrics and Gynecology

## 2023-10-16 ENCOUNTER — Other Ambulatory Visit: Payer: Self-pay

## 2023-10-16 ENCOUNTER — Encounter (HOSPITAL_COMMUNITY)
Admission: RE | Disposition: A | Discharge status: Home / Self Care | Payer: Self-pay | Source: Home / Self Care | Attending: Obstetrics and Gynecology

## 2023-10-16 ENCOUNTER — Encounter (HOSPITAL_COMMUNITY): Payer: Self-pay | Admitting: Obstetrics and Gynecology

## 2023-10-16 DIAGNOSIS — E669 Obesity, unspecified: Secondary | ICD-10-CM | POA: Insufficient documentation

## 2023-10-16 DIAGNOSIS — Z01818 Encounter for other preprocedural examination: Secondary | ICD-10-CM

## 2023-10-16 DIAGNOSIS — N838 Other noninflammatory disorders of ovary, fallopian tube and broad ligament: Secondary | ICD-10-CM | POA: Diagnosis not present

## 2023-10-16 DIAGNOSIS — Z302 Encounter for sterilization: Secondary | ICD-10-CM

## 2023-10-16 DIAGNOSIS — Z9851 Tubal ligation status: Secondary | ICD-10-CM | POA: Insufficient documentation

## 2023-10-16 DIAGNOSIS — Z975 Presence of (intrauterine) contraceptive device: Secondary | ICD-10-CM | POA: Insufficient documentation

## 2023-10-16 DIAGNOSIS — I10 Essential (primary) hypertension: Secondary | ICD-10-CM | POA: Diagnosis not present

## 2023-10-16 DIAGNOSIS — F32A Depression, unspecified: Secondary | ICD-10-CM | POA: Insufficient documentation

## 2023-10-16 DIAGNOSIS — N939 Abnormal uterine and vaginal bleeding, unspecified: Secondary | ICD-10-CM | POA: Diagnosis present

## 2023-10-16 DIAGNOSIS — R102 Pelvic and perineal pain: Secondary | ICD-10-CM | POA: Insufficient documentation

## 2023-10-16 DIAGNOSIS — Z6831 Body mass index (BMI) 31.0-31.9, adult: Secondary | ICD-10-CM | POA: Diagnosis not present

## 2023-10-16 DIAGNOSIS — I471 Supraventricular tachycardia, unspecified: Secondary | ICD-10-CM | POA: Insufficient documentation

## 2023-10-16 DIAGNOSIS — Z9079 Acquired absence of other genital organ(s): Secondary | ICD-10-CM

## 2023-10-16 HISTORY — DX: Supraventricular tachycardia, unspecified: I47.10

## 2023-10-16 HISTORY — PX: XI ROBOTIC ASSISTED SALPINGECTOMY: SHX6824

## 2023-10-16 HISTORY — PX: IUD REMOVAL: SHX5392

## 2023-10-16 HISTORY — DX: Personal history of other mental and behavioral disorders: Z86.59

## 2023-10-16 LAB — COMPREHENSIVE METABOLIC PANEL WITH GFR
ALT: 13 U/L (ref 0–44)
AST: 19 U/L (ref 15–41)
Albumin: 3.4 g/dL — ABNORMAL LOW (ref 3.5–5.0)
Alkaline Phosphatase: 58 U/L (ref 38–126)
Anion gap: 9 (ref 5–15)
BUN: 11 mg/dL (ref 6–20)
CO2: 22 mmol/L (ref 22–32)
Calcium: 8.9 mg/dL (ref 8.9–10.3)
Chloride: 109 mmol/L (ref 98–111)
Creatinine, Ser: 0.93 mg/dL (ref 0.44–1.00)
GFR, Estimated: >60 mL/min (ref >60)
Glucose, Bld: 124 mg/dL — ABNORMAL HIGH (ref 70–99)
Potassium: 4.2 mmol/L (ref 3.5–5.1)
Sodium: 140 mmol/L (ref 135–145)
Total Bilirubin: 0.6 mg/dL (ref 0.0–1.2)
Total Protein: 6.8 g/dL (ref 6.5–8.1)

## 2023-10-16 LAB — CBC
HCT: 42.3 % (ref 36.0–46.0)
Hemoglobin: 14.1 g/dL (ref 12.0–15.0)
MCH: 30.5 pg (ref 26.0–34.0)
MCHC: 33.3 g/dL (ref 30.0–36.0)
MCV: 91.6 fL (ref 80.0–100.0)
Platelets: 291 K/uL (ref 150–400)
RBC: 4.62 MIL/uL (ref 3.87–5.11)
RDW: 12.8 % (ref 11.5–15.5)
WBC: 7.1 K/uL (ref 4.0–10.5)
nRBC: 0 % (ref 0.0–0.2)

## 2023-10-16 LAB — POCT PREGNANCY, URINE: Preg Test, Ur: NEGATIVE

## 2023-10-16 SURGERY — SALPINGECTOMY, ROBOT-ASSISTED
Anesthesia: General | Site: Pelvis | Laterality: Bilateral

## 2023-10-16 MED ORDER — PHENYLEPHRINE 80 MCG/ML (10ML) SYRINGE FOR IV PUSH (FOR BLOOD PRESSURE SUPPORT)
PREFILLED_SYRINGE | INTRAVENOUS | Status: AC
  Filled 2023-10-16: qty 10

## 2023-10-16 MED ORDER — DEXAMETHASONE SODIUM PHOSPHATE 10 MG/ML IJ SOLN
INTRAMUSCULAR | Status: AC
Start: 2023-10-16 — End: 2023-10-16
  Filled 2023-10-16: qty 1

## 2023-10-16 MED ORDER — LIDOCAINE 2% (20 MG/ML) 5 ML SYRINGE
INTRAMUSCULAR | Status: AC
Start: 2023-10-16 — End: 2023-10-16
  Filled 2023-10-16: qty 5

## 2023-10-16 MED ORDER — LACTATED RINGERS IV SOLN: INTRAVENOUS | Status: DC

## 2023-10-16 MED ORDER — BUPIVACAINE HCL (PF) 0.25 % IJ SOLN
INTRAMUSCULAR | Status: AC
Start: 2023-10-16 — End: 2023-10-16
  Filled 2023-10-16: qty 60

## 2023-10-16 MED ORDER — ROCURONIUM BROMIDE 10 MG/ML (PF) SYRINGE
PREFILLED_SYRINGE | INTRAVENOUS | Status: DC | PRN
  Administered 2023-10-16: 60 mg via INTRAVENOUS

## 2023-10-16 MED ORDER — CHLORHEXIDINE GLUCONATE 0.12 % MT SOLN: 15.0000 mL | Freq: Once | OROMUCOSAL | Status: AC

## 2023-10-16 MED ORDER — ONDANSETRON HCL 4 MG/2ML IJ SOLN
INTRAMUSCULAR | Status: DC | PRN
  Administered 2023-10-16: 4 mg via INTRAVENOUS

## 2023-10-16 MED ORDER — FENTANYL CITRATE (PF) 250 MCG/5ML IJ SOLN
INTRAMUSCULAR | Status: AC
  Filled 2023-10-16: qty 5

## 2023-10-16 MED ORDER — 0.9 % SODIUM CHLORIDE (POUR BTL) OPTIME
TOPICAL | Status: DC | PRN
  Administered 2023-10-16: 1000 mL

## 2023-10-16 MED ORDER — ALBUMIN HUMAN 5 % IV SOLN
12.5000 g | Freq: Once | INTRAVENOUS | Status: AC
  Administered 2023-10-16: 12.5 g via INTRAVENOUS

## 2023-10-16 MED ORDER — POVIDONE-IODINE 10 % EX SWAB: 2.0000 | Freq: Once | CUTANEOUS | Status: DC

## 2023-10-16 MED ORDER — OXYCODONE HCL 5 MG/5ML PO SOLN: 5.0000 mg | Freq: Once | ORAL | Status: DC | PRN

## 2023-10-16 MED ORDER — KETOROLAC TROMETHAMINE 15 MG/ML IJ SOLN
INTRAMUSCULAR | Status: DC | PRN
  Administered 2023-10-16: 15 mg via INTRAVENOUS

## 2023-10-16 MED ORDER — ORAL CARE MOUTH RINSE: 15.0000 mL | Freq: Once | OROMUCOSAL | Status: AC

## 2023-10-16 MED ORDER — OXYCODONE HCL 5 MG PO TABS: 5.0000 mg | ORAL_TABLET | Freq: Once | ORAL | Status: DC | PRN

## 2023-10-16 MED ORDER — FENTANYL CITRATE (PF) 250 MCG/5ML IJ SOLN
INTRAMUSCULAR | Status: DC | PRN
  Administered 2023-10-16: 150 ug via INTRAVENOUS

## 2023-10-16 MED ORDER — PROPOFOL 10 MG/ML IV BOLUS
INTRAVENOUS | Status: DC | PRN
  Administered 2023-10-16: 160 mg via INTRAVENOUS
  Administered 2023-10-16: 200 ug/kg/min via INTRAVENOUS

## 2023-10-16 MED ORDER — MIDAZOLAM HCL 2 MG/2ML IJ SOLN
INTRAMUSCULAR | Status: AC
  Filled 2023-10-16: qty 2

## 2023-10-16 MED ORDER — ACETAMINOPHEN 500 MG PO TABS
ORAL_TABLET | ORAL | Status: AC
  Administered 2023-10-16: 1000 mg via ORAL
  Filled 2023-10-16: qty 2

## 2023-10-16 MED ORDER — MIDAZOLAM HCL 2 MG/2ML IJ SOLN
INTRAMUSCULAR | Status: DC | PRN
  Administered 2023-10-16: 2 mg via INTRAVENOUS

## 2023-10-16 MED ORDER — HYDROMORPHONE HCL 1 MG/ML IJ SOLN: 0.2500 mg | INTRAMUSCULAR | Status: DC | PRN

## 2023-10-16 MED ORDER — PROPOFOL 10 MG/ML IV BOLUS
INTRAVENOUS | Status: AC
  Filled 2023-10-16: qty 20

## 2023-10-16 MED ORDER — ACETAMINOPHEN 500 MG PO TABS: 1000.0000 mg | ORAL_TABLET | ORAL | Status: AC

## 2023-10-16 MED ORDER — ONDANSETRON HCL 4 MG/2ML IJ SOLN: 4.0000 mg | Freq: Once | INTRAMUSCULAR | Status: DC | PRN

## 2023-10-16 MED ORDER — DROPERIDOL 2.5 MG/ML IJ SOLN: 0.6250 mg | Freq: Once | INTRAMUSCULAR | Status: DC | PRN

## 2023-10-16 MED ORDER — LIDOCAINE 2% (20 MG/ML) 5 ML SYRINGE
INTRAMUSCULAR | Status: DC | PRN
  Administered 2023-10-16: 80 mg via INTRAVENOUS

## 2023-10-16 MED ORDER — CHLORHEXIDINE GLUCONATE 0.12 % MT SOLN
OROMUCOSAL | Status: AC
  Administered 2023-10-16: 15 mL via OROMUCOSAL
  Filled 2023-10-16: qty 15

## 2023-10-16 MED ORDER — PHENYLEPHRINE 80 MCG/ML (10ML) SYRINGE FOR IV PUSH (FOR BLOOD PRESSURE SUPPORT)
PREFILLED_SYRINGE | INTRAVENOUS | Status: DC | PRN
  Administered 2023-10-16 (×2): 80 ug via INTRAVENOUS
  Administered 2023-10-16: 160 ug via INTRAVENOUS

## 2023-10-16 MED ORDER — ONDANSETRON HCL 4 MG/2ML IJ SOLN
INTRAMUSCULAR | Status: AC
  Filled 2023-10-16: qty 2

## 2023-10-16 MED ORDER — EPHEDRINE 5 MG/ML INJ
INTRAVENOUS | Status: AC
  Filled 2023-10-16: qty 5

## 2023-10-16 MED ORDER — DEXAMETHASONE SODIUM PHOSPHATE 10 MG/ML IJ SOLN
INTRAMUSCULAR | Status: DC | PRN
  Administered 2023-10-16: 10 mg via INTRAVENOUS

## 2023-10-16 MED ORDER — ALBUMIN HUMAN 5 % IV SOLN
INTRAVENOUS | Status: AC
  Filled 2023-10-16: qty 250

## 2023-10-16 MED ORDER — KETOROLAC TROMETHAMINE 30 MG/ML IJ SOLN
INTRAMUSCULAR | Status: AC
  Filled 2023-10-16: qty 1

## 2023-10-16 MED ORDER — ROCURONIUM BROMIDE 10 MG/ML (PF) SYRINGE
PREFILLED_SYRINGE | INTRAVENOUS | Status: AC
  Filled 2023-10-16: qty 10

## 2023-10-16 MED ORDER — EPHEDRINE SULFATE-NACL 50-0.9 MG/10ML-% IV SOSY
PREFILLED_SYRINGE | INTRAVENOUS | Status: DC | PRN
  Administered 2023-10-16: 10 mg via INTRAVENOUS

## 2023-10-16 MED ORDER — SUGAMMADEX SODIUM 200 MG/2ML IV SOLN
INTRAVENOUS | Status: DC | PRN
  Administered 2023-10-16: 200 mg via INTRAVENOUS

## 2023-10-16 MED ORDER — BUPIVACAINE HCL (PF) 0.25 % IJ SOLN
INTRAMUSCULAR | Status: DC | PRN
  Administered 2023-10-16: 13 mL

## 2023-10-16 SURGICAL SUPPLY — 56 items
APPLICATOR ARISTA FLEXITIP XL (MISCELLANEOUS) IMPLANT
CANNULA CAP OBTURATR AIRSEAL 8 (CAP) ×2 IMPLANT
CANNULA REDUCER 12-8 DVNC XI (CANNULA) IMPLANT
CATH FOLEY 3WAY 5CC 16FR (CATHETERS) ×2 IMPLANT
COVER BACK TABLE 60X90IN (DRAPES) ×2 IMPLANT
COVER MAYO STAND STRL (DRAPES) ×2 IMPLANT
COVER TIP SHEARS 8 DVNC (MISCELLANEOUS) ×2 IMPLANT
DEFOGGER SCOPE WARM SEASHARP (MISCELLANEOUS) ×2 IMPLANT
DERMABOND ADVANCED .7 DNX12 (GAUZE/BANDAGES/DRESSINGS) ×2 IMPLANT
DRAPE ARM DVNC X/XI (DISPOSABLE) ×8 IMPLANT
DRAPE COLUMN DVNC XI (DISPOSABLE) ×2 IMPLANT
DRAPE SURG IRRIG POUCH 19X23 (DRAPES) ×4 IMPLANT
DRAPE UTILITY XL STRL (DRAPES) ×2 IMPLANT
DRIVER NDL MEGA SUTCUT DVNCXI (INSTRUMENTS) ×2 IMPLANT
DRIVER NDLE MEGA SUTCUT DVNCXI (INSTRUMENTS) ×2 IMPLANT
DURAPREP 26ML APPLICATOR (WOUND CARE) ×2 IMPLANT
ELECTRODE REM PT RTRN 9FT ADLT (ELECTROSURGICAL) ×2 IMPLANT
FORCEPS BPLR FENES DVNC XI (FORCEP) ×2 IMPLANT
FORCEPS PROGRASP DVNC XI (FORCEP) IMPLANT
GAUZE 4X4 16PLY ~~LOC~~+RFID DBL (SPONGE) IMPLANT
GLOVE BIO SURGEON STRL SZ 6.5 (GLOVE) ×6 IMPLANT
HEMOSTAT ARISTA ABSORB 3G PWDR (HEMOSTASIS) IMPLANT
HOLDER FOLEY CATH W/STRAP (MISCELLANEOUS) IMPLANT
IRRIGATION SUCT STRKRFLW 2 WTP (MISCELLANEOUS) ×2 IMPLANT
KIT PINK PAD W/HEAD ARM REST (MISCELLANEOUS) ×2 IMPLANT
KIT TURNOVER KIT B (KITS) ×2 IMPLANT
LEGGING LITHOTOMY PAIR STRL (DRAPES) ×2 IMPLANT
MANIFOLD NEPTUNE II (INSTRUMENTS) ×2 IMPLANT
MANIPULATOR ADVINCU DEL 2.5 PL (MISCELLANEOUS) IMPLANT
MANIPULATOR ADVINCU DEL 3.0 PL (MISCELLANEOUS) IMPLANT
MANIPULATOR ADVINCU DEL 3.5 PL (MISCELLANEOUS) IMPLANT
MANIPULATOR ADVINCU DEL 4.0 PL (MISCELLANEOUS) IMPLANT
NDL INSUFFLATION 14GA 120MM (NEEDLE) ×2 IMPLANT
NEEDLE INSUFFLATION 14GA 120MM (NEEDLE) ×2 IMPLANT
NS IRRIG 1000ML POUR BTL (IV SOLUTION) IMPLANT
OBTURATOR OPTICALSTD 8 DVNC (TROCAR) ×2 IMPLANT
OCCLUDER COLPOPNEUMO (BALLOONS) IMPLANT
PACK ROBOT WH (CUSTOM PROCEDURE TRAY) ×2 IMPLANT
PACK ROBOTIC GOWN (GOWN DISPOSABLE) ×2 IMPLANT
PAD OB MATERNITY 11 LF (PERSONAL CARE ITEMS) ×2 IMPLANT
SAVER CELL AAL HAEMONETICS (INSTRUMENTS) IMPLANT
SCISSORS MNPLR CVD DVNC XI (INSTRUMENTS) ×2 IMPLANT
SEAL UNIV 5-12 XI (MISCELLANEOUS) ×4 IMPLANT
SEALER VESSEL EXT DVNC XI (MISCELLANEOUS) IMPLANT
SET IRRIG Y TYPE TUR BLADDER L (SET/KITS/TRAYS/PACK) IMPLANT
SET TUBE FILTERED XL AIRSEAL (SET/KITS/TRAYS/PACK) ×2 IMPLANT
SLEEVE SCD COMPRESS KNEE MED (STOCKING) ×2 IMPLANT
SPIKE FLUID TRANSFER (MISCELLANEOUS) ×4 IMPLANT
SUT VIC AB 0 CT1 27XBRD ANBCTR (SUTURE) IMPLANT
SUT VIC AB 4-0 PS2 18 (SUTURE) ×2 IMPLANT
SUT VICRYL 0 UR6 27IN ABS (SUTURE) IMPLANT
SUT VLOC 180 0 9IN GS21 (SUTURE) ×2 IMPLANT
TOWEL GREEN STERILE (TOWEL DISPOSABLE) ×2 IMPLANT
TROCAR PORT AIRSEAL 8X100 (TROCAR) IMPLANT
UNDERPAD 30X36 HEAVY ABSORB (UNDERPADS AND DIAPERS) ×2 IMPLANT
WATER STERILE IRR 1000ML POUR (IV SOLUTION) ×2 IMPLANT

## 2023-10-16 NOTE — Op Note (Unsigned)
 NAMETIEGAN, JAMBOR MEDICAL RECORD NO: [id number] ACCOUNT NO: [id number] DATE OF BIRTH: 02-26-1981 FACILITY: [facility] LOCATION: [facility] PHYSICIAN: Ezzie Buba, MD  Operative Report   DATE OF PROCEDURE: 10/16/2023  PREOPERATIVE DIAGNOSIS:  Pelvic pain with history of Essure tubal ligation.  POSTOPERATIVE DIAGNOSIS:  Status post bilateral salpingectomy, removal of Essure, pelvic pain, removal of Mirena IUD.  PROCEDURE:  Da Vinci robot-assisted bilateral salpingectomy, bilateral removal of the Essure coils, and removal of Mirena IUD.  SURGEON:  Ezzie Buba, MD.  ASSISTANTBETHA Powell Hoops, RNFA.  ANESTHESIA:  Local and general.  ESTIMATED BLOOD LOSS:  10 mL.  IV FLUIDS AND URINE OUTPUT:  Per anesthesia.  COMPLICATIONS:  None.  PATHOLOGY:  Bilateral fallopian tubes and Essure coils to pathology.  Mirena IUD disposed of in operating room.  DESCRIPTION OF PROCEDURE:  After informed consent was reviewed with the patient and her family that accompanied her, she was transported to the operating room and placed on the table in the supine position.  General anesthesia was induced and found to be  adequate.  She was then prepped and draped in the normal sterile fashion.  After an appropriate time-out was performed, a Hulka manipulator was placed in her uterus and a Foley catheter was placed in her bladder .  The gown and gloves were changed.   Attention was turned to the abdominal portion of the case and an approximately 8-mm supraumbilical incision was made.  The fascia was cleaned off with a hemostat and using a Veress needle pneumoperitoneum was obtained.  After passing a hanging drop test  with an opening pressure of 2 mmHg, the umbilical trocar was placed under direct visualization.  Accessory ports were placed on both the right and left under direct visualization.  A brief pelvic survey was performed revealing a Mirena IUD in the pelvis.  This  was removed.  The  Essure tubal ligation device was identified.  The tubes were nicked and the Essure coils were removed bilaterally.  The tubes were then grasped at the fimbriated end, excised to the level of the cornua on both the left and right, and  removed and sent to pathology.  The right tube had a paratubal cyst.  The instrumentation was removed from her abdomen.  The pneumoperitoneum was evacuated.  The trocars were removed, and the incisions were closed with a deep subcuticular suture and  Dermabond.  The Hulka manipulator was also removed from her vagina.  The patient tolerated the procedure well.  Lap, sponge and needle counts were correct x2 per the operating room staff.   PUS D: 10/16/2023 11:32:05 am T: 10/16/2023 5:26:00 pm  JOB: [id number]/ [id number]

## 2023-10-16 NOTE — Anesthesia Preprocedure Evaluation (Addendum)
 Anesthesia Evaluation  Patient identified by MRN, date of birth, ID band Patient awake    Reviewed: Allergy & Precautions, NPO status , Patient's Chart, lab work & pertinent test results  Airway Mallampati: II  TM Distance: >3 FB Neck ROM: Full    Dental no notable dental hx. (+) Teeth Intact, Dental Advisory Given   Pulmonary neg pulmonary ROS   Pulmonary exam normal breath sounds clear to auscultation       Cardiovascular hypertension, Pt. on medications Normal cardiovascular exam+ dysrhythmias Supra Ventricular Tachycardia  Rhythm:Regular Rate:Normal  EKG 05/04/2023 NSR, Normal  Echo 05/24/23 1. Left ventricular ejection fraction, by estimation, is 60 to 65%. The  left ventricle has normal function. The left ventricle has no regional  wall motion abnormalities. Left ventricular diastolic parameters were  normal. The average left ventricular  global longitudinal strain is -21.8 %. The global longitudinal strain is  normal.   2. Right ventricular systolic function is normal. The right ventricular  size is normal. There is normal pulmonary artery systolic pressure.   3. The mitral valve is normal in structure. No evidence of mitral valve  regurgitation. No evidence of mitral stenosis.   4. The aortic valve is normal in structure. Aortic valve regurgitation is  not visualized. No aortic stenosis is present.   5. The inferior vena cava is normal in size with greater than 50%  respiratory variability, suggesting right atrial pressure of 3 mmHg.     Neuro/Psych  PSYCHIATRIC DISORDERS  Depression    Hx/o Panic attacksnegative neurological ROS     GI/Hepatic negative GI ROS, Neg liver ROS,,,  Endo/Other  Obesity  Renal/GU negative Renal ROS  negative genitourinary   Musculoskeletal negative musculoskeletal ROS (+)    Abdominal  (+) + obese  Peds  Hematology negative hematology ROS (+)   Anesthesia Other Findings    Reproductive/Obstetrics Sterilization after Essure                              Anesthesia Physical Anesthesia Plan  ASA: 2  Anesthesia Plan: General   Post-op Pain Management: Dilaudid  IV, Precedex and Tylenol  PO (pre-op)*   Induction: Intravenous  PONV Risk Score and Plan: 4 or greater and Treatment may vary due to age or medical condition, Midazolam , Ondansetron  and Dexamethasone   Airway Management Planned: Oral ETT  Additional Equipment: None  Intra-op Plan:   Post-operative Plan: Extubation in OR  Informed Consent: I have reviewed the patients History and Physical, chart, labs and discussed the procedure including the risks, benefits and alternatives for the proposed anesthesia with the patient or authorized representative who has indicated his/her understanding and acceptance.     Dental advisory given  Plan Discussed with: Anesthesiologist and CRNA  Anesthesia Plan Comments:          Anesthesia Quick Evaluation

## 2023-10-16 NOTE — Discharge Instructions (Signed)
     No acetaminophen /Tylenol  until after 2:30 pm today if needed.  No ibuprofen , Advil , Aleve, Motrin , ketorolac , meloxicam, naproxen, or other NSAIDS until after 5:00 pm today if needed.     Post Anesthesia Home Care Instructions  Activity: Get plenty of rest for the remainder of the day. A responsible individual must stay with you for 24 hours following the procedure.  For the next 24 hours, DO NOT: -Drive a car -Advertising copywriter -Drink alcoholic beverages -Take any medication unless instructed by your physician -Make any legal decisions or sign important papers.  Meals: Start with liquid foods such as gelatin or soup. Progress to regular foods as tolerated. Avoid greasy, spicy, heavy foods. If nausea and/or vomiting occur, drink only clear liquids until the nausea and/or vomiting subsides. Call your physician if vomiting continues.  Special Instructions/Symptoms: Your throat may feel dry or sore from the anesthesia or the breathing tube placed in your throat during surgery. If this causes discomfort, gargle with warm salt water. The discomfort should disappear within 24 hours.

## 2023-10-16 NOTE — Interval H&P Note (Signed)
 History and Physical Interval Note:  10/16/2023 9:15 AM  Jocelyn Bridges  has presented today for surgery, with the diagnosis of sterilization after essure.  The various methods of treatment have been discussed with the patient and family. After consideration of risks, benefits and other options for treatment, the patient has consented to  Procedure(s) with comments: SALPINGECTOMY, ROBOT-ASSISTED (Bilateral) - removal of essure as a surgical intervention.  The patient's history has been reviewed, patient examined, no change in status, stable for surgery.  I have reviewed the patient's chart and labs.  Questions were answered to the patient's satisfaction.     [name]

## 2023-10-16 NOTE — Brief Op Note (Signed)
 10/16/2023  11:25 AM  PATIENT:  Jocelyn Bridges  43 y.o. female  PRE-OPERATIVE DIAGNOSIS:  sterilization after essure, pelvic pain  POST-OPERATIVE DIAGNOSIS:  sterilization after essure, pelvic pain, removal of Mirena IUD  PROCEDURE:  Procedure(s) with comments: SALPINGECTOMY, ROBOT-ASSISTED WITH REMOVAL OF ESSURE (Bilateral) - removal of essure REMOVAL, INTRAUTERINE DEVICE  SURGEON:  Surgeons and Role:    * Bovard-Stuckert, Ezzie, MD - Primary  ASSISTANTS: Lestine Moats RNFA   ANESTHESIA:   local and general  EBL:  10 mL IVF and uop per anesthesia  DRAINS: none   LOCAL MEDICATIONS USED:  MARCAINE      SPECIMEN:  Source of Specimen:  bilateral fallopian tubes, essure coils, IUD disposed of in OR  DISPOSITION OF SPECIMEN:  PATHOLOGY  COUNTS:  YES  TOURNIQUET:  * No tourniquets in log *  DICTATION: .Other Dictation: Dictation Number [id number]  PLAN OF CARE: Discharge to home after PACU  PATIENT DISPOSITION:  PACU - hemodynamically stable.   Delay start of Pharmacological VTE agent (>24hrs) due to surgical blood loss or risk of bleeding: yes

## 2023-10-16 NOTE — Transfer of Care (Signed)
 Immediate Anesthesia Transfer of Care Note  Patient: Jocelyn Bridges  Procedure(s) Performed: SALPINGECTOMY, ROBOT-ASSISTED WITH REMOVAL OF ESSURE (Bilateral: Pelvis) REMOVAL, INTRAUTERINE DEVICE (Pelvis)  Patient Location: PACU  Anesthesia Type:General  Level of Consciousness: awake and drowsy  Airway & Oxygen Therapy: Patient Spontanous Breathing  Post-op Assessment: Report given to RN and Post -op Vital signs reviewed and stable  Post vital signs: Reviewed and stable  Last Vitals:  Vitals Value Taken Time  BP 83/56 10/16/23 11:19  Temp    Pulse 55 10/16/23 11:20  Resp 20 10/16/23 11:20  SpO2 96 % 10/16/23 11:20  Vitals shown include unfiled device data.  Last Pain:  Vitals:   10/16/23 0837  TempSrc: Oral  PainSc: 0-No pain         Complications: No notable events documented.

## 2023-10-16 NOTE — Anesthesia Postprocedure Evaluation (Signed)
 Anesthesia Post Note  Patient: Jocelyn Bridges  Procedure(s) Performed: SALPINGECTOMY, ROBOT-ASSISTED WITH REMOVAL OF ESSURE (Bilateral: Pelvis) REMOVAL, INTRAUTERINE DEVICE (Pelvis)     Patient location during evaluation: PACU Anesthesia Type: General Level of consciousness: awake and alert and oriented Pain management: pain level controlled Vital Signs Assessment: post-procedure vital signs reviewed and stable Respiratory status: spontaneous breathing, nonlabored ventilation and respiratory function stable Cardiovascular status: blood pressure returned to baseline and stable Postop Assessment: no apparent nausea or vomiting Anesthetic complications: no   No notable events documented.  Last Vitals:  Vitals:   10/16/23 1230 10/16/23 1245  BP: 105/74 112/76  Pulse: 65 68  Resp: 17 18  Temp: 36.4 C   SpO2: 100% 99%    Last Pain:  Vitals:   10/16/23 1245  TempSrc:   PainSc: 0-No pain                 Tal Kempker A.

## 2023-10-17 ENCOUNTER — Encounter (HOSPITAL_COMMUNITY): Payer: Self-pay | Admitting: Obstetrics and Gynecology

## 2023-10-17 LAB — SURGICAL PATHOLOGY
